# Patient Record
Sex: Female | Born: 1960 | Race: White | Hispanic: No | Marital: Married | Smoking: Former smoker
Health system: Southern US, Community
[De-identification: ages and names within clinical notes are randomized; demographics above are authoritative.]

## PROBLEM LIST (undated history)

## (undated) DIAGNOSIS — Z5189 Encounter for other specified aftercare: Secondary | ICD-10-CM

## (undated) DIAGNOSIS — G709 Myoneural disorder, unspecified: Secondary | ICD-10-CM

## (undated) DIAGNOSIS — N189 Chronic kidney disease, unspecified: Secondary | ICD-10-CM

## (undated) DIAGNOSIS — G629 Polyneuropathy, unspecified: Secondary | ICD-10-CM

## (undated) DIAGNOSIS — G8929 Other chronic pain: Secondary | ICD-10-CM

## (undated) DIAGNOSIS — M549 Dorsalgia, unspecified: Secondary | ICD-10-CM

## (undated) DIAGNOSIS — T7840XA Allergy, unspecified, initial encounter: Secondary | ICD-10-CM

## (undated) DIAGNOSIS — E785 Hyperlipidemia, unspecified: Secondary | ICD-10-CM

## (undated) DIAGNOSIS — N809 Endometriosis, unspecified: Secondary | ICD-10-CM

## (undated) DIAGNOSIS — I10 Essential (primary) hypertension: Secondary | ICD-10-CM

## (undated) HISTORY — DX: Myoneural disorder, unspecified: G70.9

## (undated) HISTORY — DX: Essential (primary) hypertension: I10

## (undated) HISTORY — DX: Chronic kidney disease, unspecified: N18.9

## (undated) HISTORY — DX: Allergy, unspecified, initial encounter: T78.40XA

## (undated) HISTORY — DX: Dorsalgia, unspecified: M54.9

## (undated) HISTORY — DX: Hyperlipidemia, unspecified: E78.5

## (undated) HISTORY — DX: Endometriosis, unspecified: N80.9

## (undated) HISTORY — DX: Encounter for other specified aftercare: Z51.89

## (undated) HISTORY — DX: Polyneuropathy, unspecified: G62.9

## (undated) HISTORY — DX: Other chronic pain: G89.29

---
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
## 1982-10-13 HISTORY — PX: SPINE SURGERY: SHX786

## 1992-10-13 HISTORY — PX: EYE SURGERY: SHX253

## 2006-10-13 HISTORY — PX: ABDOMINAL HYSTERECTOMY: SHX81

## 2007-10-14 HISTORY — PX: OOPHORECTOMY: SHX86

## 2008-10-13 DIAGNOSIS — G629 Polyneuropathy, unspecified: Secondary | ICD-10-CM

## 2008-10-13 HISTORY — DX: Polyneuropathy, unspecified: G62.9

## 2008-10-13 HISTORY — PX: KIDNEY STONE SURGERY: SHX686

## 2011-10-14 DIAGNOSIS — N809 Endometriosis, unspecified: Secondary | ICD-10-CM

## 2011-10-14 HISTORY — DX: Endometriosis, unspecified: N80.9

## 2017-11-16 ENCOUNTER — Ambulatory Visit: Payer: BC Managed Care – PPO | Admitting: Family Medicine

## 2017-11-16 ENCOUNTER — Encounter: Payer: Self-pay | Admitting: Family Medicine

## 2017-11-16 VITALS — BP 117/79 | HR 93 | Temp 97.8°F | Ht 66.0 in | Wt 219.0 lb

## 2017-11-16 DIAGNOSIS — I10 Essential (primary) hypertension: Secondary | ICD-10-CM

## 2017-11-16 DIAGNOSIS — Z780 Asymptomatic menopausal state: Secondary | ICD-10-CM | POA: Diagnosis not present

## 2017-11-16 DIAGNOSIS — R5383 Other fatigue: Secondary | ICD-10-CM | POA: Diagnosis not present

## 2017-11-16 DIAGNOSIS — Z1231 Encounter for screening mammogram for malignant neoplasm of breast: Secondary | ICD-10-CM | POA: Diagnosis not present

## 2017-11-16 DIAGNOSIS — N809 Endometriosis, unspecified: Secondary | ICD-10-CM | POA: Diagnosis not present

## 2017-11-16 DIAGNOSIS — Z1239 Encounter for other screening for malignant neoplasm of breast: Secondary | ICD-10-CM

## 2017-11-16 DIAGNOSIS — E782 Mixed hyperlipidemia: Secondary | ICD-10-CM

## 2017-11-16 DIAGNOSIS — E785 Hyperlipidemia, unspecified: Secondary | ICD-10-CM | POA: Insufficient documentation

## 2017-11-16 MED ORDER — "BD LUER-LOK SYRINGE 25G X 1"" 3 ML MISC": 1 refills | Status: AC

## 2017-11-16 MED ORDER — PRAVASTATIN SODIUM 40 MG PO TABS: 40.0000 mg | ORAL_TABLET | Freq: Every day | ORAL | 1 refills | Status: DC

## 2017-11-16 MED ORDER — DICYCLOMINE HCL 10 MG PO CAPS: 10.0000 mg | ORAL_CAPSULE | Freq: Three times a day (TID) | ORAL | 1 refills | Status: DC

## 2017-11-16 MED ORDER — GABAPENTIN 800 MG PO TABS: ORAL_TABLET | ORAL | 5 refills | Status: DC

## 2017-11-16 MED ORDER — BUTALBITAL-ACETAMINOPHEN 50-300 MG PO TABS: ORAL_TABLET | ORAL | 0 refills | Status: AC

## 2017-11-16 MED ORDER — LISINOPRIL 30 MG PO TABS: 30.0000 mg | ORAL_TABLET | Freq: Every day | ORAL | 1 refills | Status: DC

## 2017-11-16 MED ORDER — VERAPAMIL HCL 120 MG PO TABS: 120.0000 mg | ORAL_TABLET | Freq: Every day | ORAL | 1 refills | Status: DC
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
## 2017-11-17 LAB — CBC WITH DIFFERENTIAL/PLATELET
BASOS: 0 %
Basophils Absolute: 0 10*3/uL (ref 0.0–0.2)
EOS (ABSOLUTE): 0.4 10*3/uL (ref 0.0–0.4)
Eos: 4 %
HEMOGLOBIN: 13 g/dL (ref 11.1–15.9)
Hematocrit: 39.8 % (ref 34.0–46.6)
IMMATURE GRANS (ABS): 0 10*3/uL (ref 0.0–0.1)
Immature Granulocytes: 0 %
LYMPHS: 33 %
Lymphocytes Absolute: 3.4 10*3/uL — ABNORMAL HIGH (ref 0.7–3.1)
MCH: 30.9 pg (ref 26.6–33.0)
MCHC: 32.7 g/dL (ref 31.5–35.7)
MCV: 95 fL (ref 79–97)
Monocytes Absolute: 0.6 10*3/uL (ref 0.1–0.9)
Monocytes: 6 %
NEUTROS ABS: 5.8 10*3/uL (ref 1.4–7.0)
Neutrophils: 57 %
Platelets: 305 10*3/uL (ref 150–379)
RBC: 4.21 x10E6/uL (ref 3.77–5.28)
RDW: 13.4 % (ref 12.3–15.4)
WBC: 10.2 10*3/uL (ref 3.4–10.8)

## 2017-11-17 LAB — CMP14+EGFR
A/G RATIO: 1.8 (ref 1.2–2.2)
ALBUMIN: 4.5 g/dL (ref 3.5–5.5)
ALT: 21 IU/L (ref 0–32)
AST: 16 IU/L (ref 0–40)
Alkaline Phosphatase: 95 IU/L (ref 39–117)
BUN / CREAT RATIO: 13 (ref 9–23)
BUN: 12 mg/dL (ref 6–24)
Bilirubin Total: 0.3 mg/dL (ref 0.0–1.2)
CALCIUM: 9.2 mg/dL (ref 8.7–10.2)
CO2: 24 mmol/L (ref 20–29)
Chloride: 101 mmol/L (ref 96–106)
Creatinine, Ser: 0.96 mg/dL (ref 0.57–1.00)
GFR, EST AFRICAN AMERICAN: 78 mL/min/{1.73_m2} (ref >59)
GFR, EST NON AFRICAN AMERICAN: 67 mL/min/{1.73_m2} (ref >59)
GLOBULIN, TOTAL: 2.5 g/dL (ref 1.5–4.5)
Glucose: 111 mg/dL — ABNORMAL HIGH (ref 65–99)
POTASSIUM: 3.8 mmol/L (ref 3.5–5.2)
Sodium: 141 mmol/L (ref 134–144)
TOTAL PROTEIN: 7 g/dL (ref 6.0–8.5)

## 2017-11-17 LAB — TSH: TSH: 1.47 u[IU]/mL (ref 0.450–4.500)

## 2017-11-17 LAB — LIPID PANEL
CHOL/HDL RATIO: 2.9 ratio (ref 0.0–4.4)
Cholesterol, Total: 154 mg/dL (ref 100–199)
HDL: 53 mg/dL (ref >39)
LDL Calculated: 71 mg/dL (ref 0–99)
Triglycerides: 148 mg/dL (ref 0–149)
VLDL Cholesterol Cal: 30 mg/dL (ref 5–40)

## 2017-11-17 LAB — T4, FREE: Free T4: 1.37 ng/dL (ref 0.82–1.77)

## 2017-11-17 LAB — VITAMIN D 25 HYDROXY (VIT D DEFICIENCY, FRACTURES): VIT D 25 HYDROXY: 34.8 ng/mL (ref 30.0–100.0)

## 2017-11-19 ENCOUNTER — Telehealth: Payer: Self-pay | Admitting: Family Medicine

## 2017-12-07 ENCOUNTER — Telehealth: Payer: Self-pay | Admitting: Family Medicine

## 2017-12-21 ENCOUNTER — Ambulatory Visit: Payer: BC Managed Care – PPO | Admitting: Gynecology

## 2017-12-21 ENCOUNTER — Encounter: Payer: Self-pay | Admitting: Gynecology

## 2017-12-21 VITALS — BP 124/74 | Ht 65.0 in | Wt 218.0 lb

## 2017-12-21 DIAGNOSIS — Z124 Encounter for screening for malignant neoplasm of cervix: Secondary | ICD-10-CM | POA: Diagnosis not present

## 2017-12-21 DIAGNOSIS — R102 Pelvic and perineal pain: Secondary | ICD-10-CM

## 2017-12-21 DIAGNOSIS — N809 Endometriosis, unspecified: Secondary | ICD-10-CM

## 2017-12-21 NOTE — Progress Notes (Signed)
Bethany Ford 1960/12/21 [id number]        57 y.o.  G1P0010 new patient who presents with her husband complaining of pelvic pain.  She has a history of supracervical hysterectomy with RSO and then subsequently had LSO in 2008 and 2009 with reported endometriosis.  No use of hormones afterwards.  Was told that the endometriosis should resolve.  Has noted though she has had pain since to include daily sharp stabbing to aching pain.  Particularly noticeable when she voids or has bowel movements exacerbating the pain.  Also unable to have intercourse due to the pain.  No consistent diarrhea or constipation.  No nausea vomiting.  No fever or chills.  No UTI symptoms such as frequency dysuria urgency low back pain.  Patient also having significant hot flushes and sweats at that she has had since the hysterectomy.  We history of abnormal Pap smears as a teenager but none since.  Past medical history,surgical history, problem list, medications, allergies, family history and social history were all reviewed and documented as reviewed in the EPIC chart.  ROS:  Performed with pertinent positives and negatives included in the history, assessment and plan.   Additional significant findings : None   Exam: Kennon Portela assistant Vitals:   12/21/17 1400  BP: 124/74  Weight: 218 lb (98.9 kg)  Height: 5\' 5"  (1.651 m)   Body mass index is 36.28 kg/m.  General appearance:  Normal affect, orientation and appearance. Skin: Grossly normal HEENT: Without gross lesions.  No cervical or supraclavicular adenopathy. Thyroid normal.  Lungs:  Clear without wheezing, rales or rhonchi Cardiac: RR, without RMG Abdominal:  Soft, nontender, without masses, guarding, rebound, organomegaly or hernia Breasts:  Examined lying and sitting without masses, retractions, discharge or axillary adenopathy. Pelvic:  Ext, BUS, Vagina: Grossly normal on exam.  Patient having extreme pain with speculum exam  Cervix: Apparent at the  top of the vagina.  Pap smear done.  Exam somewhat limited by patient's pain.  Uterus: Not palpated  Adnexa: Without masses or tenderness    Anus and perineum: Normal   Rectovaginal: Normal sphincter tone without palpated masses or tenderness.    Assessment/Plan:  57 y.o. G1P0010 .   1. Pelvic pain.  History of endometriosis with reported supracervical hysterectomy and RSO and subsequent separate LSO.  No hormone use afterwards.  Also having hot flashes and sweats.  Recent TSH CBC comprehensive metabolic panel and lipid profile were normal.  We reviewed the possibilities to include active endometriosis although unlikely after BSO.  Possible adhesions leading to her pain with due to endometriosis in the past.  Primary bladder such as interstitial cystitis noting she does have bladder pain particularly when her bladder is full when she voids although not having frequency dysuria urgency or other type symptoms.  GI etiology noting again with bowel movements she has exacerbation of her pain.  Her last colonoscopy was 10 years ago but says she could never go undergo another one because of the bowel prep leading to diarrhea which causes intense pain.  We discussed that there really is a blurred between the various organ systems as far as her pain is concerned with some bladder symptoms, some GI symptoms and a history of endometriosis and possible adhesions.  We discussed possible treatment options to include possible vaginal estrogen given the intense discomfort when I try to do her pelvic exam with the speculum although with digital exam it was not as bad.  The issues of vaginal  estrogen and absorption with systemic effects and the possible also reviewed.  Systemic HRT discussed to also treat her hot flashes and sweats but she is not interested at this point of considering HRT.  At this point I would like to see her operative reports from both her hysterectomy and her subsequent LSO.  Discussed possible CT scan  or other imaging to look at her pelvis to make sure there is nothing more significant as a cause for her pain.  [facility].  Possible urology workup for interstitial cystitis and GI evaluation also discussed.  Will further discuss after reviewing her operative reports.  We will check baseline urine analysis today and FSH just to verify she is menopausal. 2. Colonoscopy 2008.  Need to consider now discussed although she feels that she could not tolerate the prep.  She will further discuss colon screening with her primary physician. 3. Mammography 2013.  Reminded patient she is overdue and she agrees to schedule.  Breast exam normal today. 4. Pap smear 2008.  Reported abnormal Pap smears as a teenager but normal since.  Pap smear done today as she still has her cervix as it appears on exam. 5. Health maintenance.  No routine lab work done as she recently had this done.  Patient will follow up with her operative reports and for her lab work and then will go from there.  Greater than 50% of my time was spent in direct face to face counseling and coordination of care with the patient.    Dara Lordsimothy P Via Rosado MD, 2:28 PM 12/21/2017

## 2017-12-21 NOTE — Addendum Note (Signed)
Addended by: Dayna BarkerGARDNER, Drue Camera K on: 12/21/2017 03:38 PM   Modules accepted: Orders

## 2017-12-21 NOTE — Patient Instructions (Signed)
Office will call with lab test results and after reviewing the operative reports from your prior surgery.

## 2017-12-22 LAB — FOLLICLE STIMULATING HORMONE: FSH: 46.7 m[IU]/mL

## 2017-12-24 LAB — PAP IG W/ RFLX HPV ASCU

## 2017-12-30 ENCOUNTER — Encounter: Payer: Self-pay | Admitting: Gynecology

## 2018-01-15 ENCOUNTER — Ambulatory Visit: Payer: BC Managed Care – PPO | Admitting: Family Medicine

## 2018-01-15 ENCOUNTER — Encounter: Payer: Self-pay | Admitting: Family Medicine

## 2018-01-15 VITALS — BP 123/81 | HR 86 | Temp 98.1°F | Ht 65.0 in | Wt 221.0 lb

## 2018-01-15 DIAGNOSIS — I10 Essential (primary) hypertension: Secondary | ICD-10-CM | POA: Diagnosis not present

## 2018-01-15 DIAGNOSIS — N921 Excessive and frequent menstruation with irregular cycle: Secondary | ICD-10-CM

## 2018-01-15 DIAGNOSIS — R0683 Snoring: Secondary | ICD-10-CM | POA: Diagnosis not present

## 2018-01-15 DIAGNOSIS — E782 Mixed hyperlipidemia: Secondary | ICD-10-CM

## 2018-01-15 NOTE — Progress Notes (Signed)
Subjective:  Patient ID: Bethany Ford, female    DOB: 1961/03/03  Age: 57 y.o. MRN: [id number]  CC: Follow-up (pt here today for follow up and wants to discuss her medical history)   HPI Bethany Ford presents for  Follow-up of hypertension. Patient has no history of headache chest pain or shortness of breath or recent cough. Patient also denies symptoms of TIA such as numbness weakness lateralizing. Patient checks  blood pressure at home and has not had any elevated readings recently. Patient denies side effects from his medication. States taking it regularly. Patient in for follow-up of elevated cholesterol. Doing well without complaints on current medication. Denies side effects of statin including myalgia and arthralgia and nausea. Also in today for liver function testing. Currently no chest pain, shortness of breath or other cardiovascular related symptoms noted.  Patient is also having intermenstrual bleeding.  She would like to have a referral to a gynecologist.  She also has chronic back pain for which she takes oxycodone and gabapentin.  Depression screen PHQ 2/9 01/15/2018  Decreased Interest 0  Down, Depressed, Hopeless 0  PHQ - 2 Score 0    History Bethany Ford has a past medical history of Allergy, Blood transfusion without reported diagnosis (1982), Chronic back pain, Chronic kidney disease, Endometriosis (2013), Hyperlipidemia, Hypertension, Neuromuscular disorder (HCC), and Neuropathy (2010).   She has a past surgical history that includes Eye surgery (Left, 1994); Spine surgery (1982); Spine surgery (1984); Kidney stone surgery (2010); Oophorectomy (2009); and Abdominal hysterectomy (2008).   Her family history includes Cancer in her brother and father; Diabetes in her father; Heart disease in her maternal grandfather and mother; Hyperlipidemia in her mother and sister; Hypertension in her mother; Learning disabilities in her brother, brother, and brother; Multiple sclerosis in her  brother and sister.She reports that she quit smoking about 20 years ago. Her smoking use included cigarettes. She has a 20.00 pack-year smoking history. She has never used smokeless tobacco. She reports that she does not drink alcohol or use drugs.    ROS Review of Systems  Constitutional: Negative.   HENT: Negative for congestion.   Eyes: Negative for visual disturbance.  Respiratory: Negative for shortness of breath.   Cardiovascular: Negative for chest pain.  Gastrointestinal: Negative for abdominal pain, constipation, diarrhea, nausea and vomiting.  Genitourinary: Negative for difficulty urinating.  Musculoskeletal: Negative for arthralgias and myalgias.  Neurological: Negative for headaches.  Psychiatric/Behavioral: Negative for sleep disturbance.    Objective:  BP 123/81   Pulse 86   Temp 98.1 F (36.7 C) (Oral)   Ht 5\' 5"  (1.651 m)   Wt 221 lb (100.2 kg)   BMI 36.78 kg/m    BP Readings from Last 3 Encounters:  01/15/18 123/81  12/21/17 124/74  11/16/17 117/79    Wt Readings from Last 3 Encounters:  01/15/18 221 lb (100.2 kg)  12/21/17 218 lb (98.9 kg)  11/16/17 219 lb (99.3 kg)     Physical Exam  Constitutional: She is oriented to person, place, and time. She appears well-developed and well-nourished. No distress.  HENT:  Head: Normocephalic and atraumatic.  Right Ear: External ear normal.  Left Ear: External ear normal.  Neck: Normal range of motion. Neck supple.  Cardiovascular: Normal rate, regular rhythm and normal heart sounds.  No murmur heard. Pulmonary/Chest: Effort normal and breath sounds normal. No respiratory distress. She has no wheezes. She has no rales.  Abdominal: Soft. There is no tenderness.  Musculoskeletal: Normal range of motion. She exhibits  no edema or tenderness.  Neurological: She is alert and oriented to person, place, and time. She exhibits normal muscle tone. Coordination normal.  Skin: Skin is warm and dry.  Psychiatric: She  has a normal mood and affect. Her behavior is normal.      Assessment & Plan:   [name] was seen today for follow-up.  Diagnoses and all orders for this visit:  Essential hypertension  Mixed hyperlipidemia  Snoring -     Ambulatory referral to Sleep Studies  Metrorrhagia -     Ambulatory referral to Gynecology       I am having Bethany Doffing maintain her cyanocobalamin, fluticasone, oxycodone, B-D 3CC LUER-LOK SYR 25GX1", dicyclomine, gabapentin, lisinopril, pravastatin, verapamil, Butalbital-Acetaminophen, and phenytoin.  Allergies as of 01/15/2018      Reactions   Latex Hives   severe   Vitamin E Other (See Comments)   Severe neuropathy   Codeine Nausea And Vomiting   moderate      Medication List        Accurate as of 01/15/18 11:59 PM. Always use your most recent med list.          B-D 3CC LUER-LOK SYR 25GX1" 25G X 1" 3 ML Misc Generic drug:  SYRINGE-NEEDLE (DISP) 3 ML 12use as directed every week   Butalbital-Acetaminophen 50-300 MG Tabs 1 BID prn HA   cyanocobalamin 1000 MCG/ML injection Commonly known as:  (VITAMIN B-12) inject 1 cc intramuscularly weekly for 30 DAYS   dicyclomine 10 MG capsule Commonly known as:  BENTYL Take 1 capsule (10 mg total) by mouth 3 (three) times daily before meals.   fluticasone 50 MCG/ACT nasal spray Commonly known as:  FLONASE   gabapentin 800 MG tablet Commonly known as:  NEURONTIN take 1 and 1/2 tablet by mouth three times a day   lisinopril 30 MG tablet Commonly known as:  PRINIVIL,ZESTRIL Take 1 tablet (30 mg total) by mouth daily.   oxycodone 30 MG immediate release tablet Commonly known as:  ROXICODONE take 1 tablet by mouth every 6 hours if needed for 30 DAYS   phenytoin 100 MG ER capsule Commonly known as:  DILANTIN TK 1 C PO TID   pravastatin 40 MG tablet Commonly known as:  PRAVACHOL Take 1 tablet (40 mg total) by mouth daily.   verapamil 120 MG tablet Commonly known as:  CALAN Take 1  tablet (120 mg total) by mouth daily.        Follow-up: Return in about 6 months (around 07/17/2018).  Mechele Claude, M.D.

## 2018-01-17 ENCOUNTER — Encounter: Payer: Self-pay | Admitting: Family Medicine

## 2018-01-19 ENCOUNTER — Telehealth: Payer: Self-pay | Admitting: *Deleted

## 2018-01-19 NOTE — Telephone Encounter (Signed)
We received referral [facility] with diagnosis metrorrhagia. Per Dr.Fontaine note on 12/21/17 patient has had hysterectomy. I called patient to discuss and patient said she is not having any vaginal bleeding. I told her the PCP had sent a referral to this office about vaginal bleeding, once again pt declined stating "I have had a hysterectomy and no bleeding has occurred". I confirmed her date of birth and the PCP office and it all matched. So not referral will be made to this office regarding this, as apparently this was sent in error.

## 2018-03-09 LAB — HM MAMMOGRAPHY

## 2018-03-10 ENCOUNTER — Ambulatory Visit: Payer: BC Managed Care – PPO | Admitting: Family Medicine

## 2018-03-10 ENCOUNTER — Encounter: Payer: Self-pay | Admitting: Family Medicine

## 2018-03-10 VITALS — BP 117/81 | HR 92 | Temp 97.8°F | Ht 65.0 in | Wt 221.0 lb

## 2018-03-10 DIAGNOSIS — N3000 Acute cystitis without hematuria: Secondary | ICD-10-CM | POA: Diagnosis not present

## 2018-03-10 DIAGNOSIS — I10 Essential (primary) hypertension: Secondary | ICD-10-CM | POA: Diagnosis not present

## 2018-03-10 LAB — URINALYSIS, COMPLETE
Bilirubin, UA: NEGATIVE
Glucose, UA: NEGATIVE
Ketones, UA: NEGATIVE
Nitrite, UA: POSITIVE — AB
PH UA: 6 (ref 5.0–7.5)
PROTEIN UA: NEGATIVE
Specific Gravity, UA: 1.015 (ref 1.005–1.030)
UUROB: 0.2 mg/dL (ref 0.2–1.0)

## 2018-03-10 LAB — MICROSCOPIC EXAMINATION
RBC, UA: >30 /hpf — AB (ref 0–2)
RENAL EPITHEL UA: NONE SEEN /HPF

## 2018-03-10 MED ORDER — SULFAMETHOXAZOLE-TRIMETHOPRIM 800-160 MG PO TABS: 1.0000 | ORAL_TABLET | Freq: Two times a day (BID) | ORAL | 0 refills | Status: DC

## 2018-03-10 NOTE — Progress Notes (Signed)
BP 117/81   Pulse 92   Temp 97.8 F (36.6 C) (Oral)   Ht  (1.651 m)   Wt 221 lb (100.2 kg)   BMI 36.78 kg/m    Subjective:    Patient ID: Bethany Ford, female    DOB: [birth year], 57 y.o.   MRN: [id number]  HPI: Bethany Ford is a 57 y.o. female presenting on 03/10/2018 for Discuss BP (Feels dizzy and drunk at times) and Dysuria   HPI Lightheaded and dizzy and hypotensive episodes Patient has been having lightheaded and dizzy and hypotensive episodes that have been occurring over the past couple weeks.  She does admit that she started using her Flomax on top of her normal blood pressure medication and has been taking her blood pressure at home and it has been on the lower end.  Urinary frequency and burning and pain Patient has dysuria and burning and lower abdominal pain that has been going on for the past 3 days.  She denies any hematuria or fevers or chills or flank pain.  She has been trying to increase her fluid intake to help flush out which has been helping some.  Relevant past medical, surgical, family and social history reviewed and updated as indicated. Interim medical history since our last visit reviewed. Allergies and medications reviewed and updated.  Review of Systems  Constitutional: Negative for chills and fever.  Eyes: Negative for visual disturbance.  Respiratory: Negative for chest tightness and shortness of breath.   Cardiovascular: Negative for chest pain and leg swelling.  Genitourinary: Positive for difficulty urinating, dysuria, frequency and urgency.  Musculoskeletal: Negative for back pain and gait problem.  Skin: Negative for rash.  Neurological: Positive for dizziness and light-headedness. Negative for weakness, numbness and headaches.  Psychiatric/Behavioral: Negative for agitation and behavioral problems.  All other systems reviewed and are negative.   Per HPI unless specifically indicated above   Allergies as of 03/10/2018      Reactions    Latex Hives   severe   Vitamin E Other (See Comments)   Severe neuropathy   Codeine Nausea And Vomiting   moderate      Medication List        Accurate as of 03/10/18  8:54 AM. Always use your most recent med list.          B-D 3CC LUER-LOK SYR 25GX1" 25G X 1" 3 ML Misc Generic drug:  SYRINGE-NEEDLE (DISP) 3 ML 12use as directed every week   Butalbital-Acetaminophen 50-300 MG Tabs 1 BID prn HA   cyanocobalamin 1000 MCG/ML injection Commonly known as:  (VITAMIN B-12) inject 1 cc intramuscularly weekly for 30 DAYS   dicyclomine 10 MG capsule Commonly known as:  BENTYL Take 1 capsule (10 mg total) by mouth 3 (three) times daily before meals.   fluticasone 50 MCG/ACT nasal spray Commonly known as:  FLONASE   gabapentin 300 MG capsule Commonly known as:  NEURONTIN Take 300 mg by mouth 4 (four) times daily.   lisinopril 30 MG tablet Commonly known as:  PRINIVIL,ZESTRIL Take 1 tablet (30 mg total) by mouth daily.   oxycodone 30 MG immediate release tablet Commonly known as:  ROXICODONE take 1 tablet by mouth every 6 hours if needed for 30 DAYS   pravastatin 40 MG tablet Commonly known as:  PRAVACHOL Take 1 tablet (40 mg total) by mouth daily.   tamsulosin 0.4 MG Caps capsule Commonly known as:  FLOMAX Take 0.4 mg by mouth.   URIBEL PO  Take by mouth.          Objective:    BP 117/81   Pulse 92   Temp 97.8 F (36.6 C) (Oral)   Ht  (1.651 m)   Wt 221 lb (100.2 kg)   BMI 36.78 kg/m   Wt Readings from Last 3 Encounters:  03/10/18 221 lb (100.2 kg)  01/15/18 221 lb (100.2 kg)  12/21/17 218 lb (98.9 kg)    Physical Exam  Constitutional: She is oriented to person, place, and time. She appears well-developed and well-nourished. No distress.  Eyes: Pupils are equal, round, and reactive to light. Conjunctivae and EOM are normal.  Cardiovascular: Normal rate, regular rhythm, normal heart sounds and intact distal pulses.  No murmur  heard. Pulmonary/Chest: Effort normal and breath sounds normal. No respiratory distress. She has no wheezes.  Abdominal: Soft. Bowel sounds are normal. She exhibits no distension. There is no tenderness. There is no rigidity, no guarding and no CVA tenderness.  Musculoskeletal: Normal range of motion. She exhibits no edema.  Neurological: She is alert and oriented to person, place, and time. Coordination normal.  Skin: Skin is warm and dry. No rash noted. She is not diaphoretic.  Psychiatric: She has a normal mood and affect. Her behavior is normal.  Nursing note and vitals reviewed.   Urinalysis WBC greater than 30 RBC greater than 30, many bacteria, nitrite positive, 3+ blood, 2+ leukocytes, 1+ bilirubin    Assessment & Plan:   Problem List Items Addressed This Visit      Cardiovascular and Mediastinum   Hypertension    Other Visit Diagnoses    Acute cystitis without hematuria    -  Primary   Relevant Medications   sulfamethoxazole-trimethoprim (BACTRIM DS,SEPTRA DS) 800-160 MG tablet   Other Relevant Orders   Urinalysis, Complete (Completed)    Patient is having hypotensive episodes in the mornings, it looks like it is a combination between when she starts using her Flomax blood pressure kicks down and the verapamil.  We will discontinue the verapamil and see how her blood pressures do when she is on and off the Flomax but may need to consider in the future a blood pressure medication that she uses only when she is not using the Flomax.  Follow up plan: Return if symptoms worsen or fail to improve, for Patient already has follow-up appointment in August.  Counseling provided for all of the vaccine components Orders Placed This Encounter  Procedures  . Urinalysis, Complete    Arville Care, MD [facility] 03/10/2018, 8:54 AM

## 2018-04-12 ENCOUNTER — Encounter: Payer: Self-pay | Admitting: Family Medicine

## 2018-04-12 ENCOUNTER — Ambulatory Visit: Payer: BC Managed Care – PPO | Admitting: Family Medicine

## 2018-04-12 VITALS — BP 131/89 | HR 88 | Temp 97.9°F | Ht 65.0 in | Wt 221.0 lb

## 2018-04-12 DIAGNOSIS — N3000 Acute cystitis without hematuria: Secondary | ICD-10-CM | POA: Diagnosis not present

## 2018-04-12 LAB — URINALYSIS, COMPLETE
Bilirubin, UA: NEGATIVE
GLUCOSE, UA: NEGATIVE
KETONES UA: NEGATIVE
NITRITE UA: NEGATIVE
Specific Gravity, UA: 1.015 (ref 1.005–1.030)
UUROB: 0.2 mg/dL (ref 0.2–1.0)
pH, UA: 6.5 (ref 5.0–7.5)

## 2018-04-12 LAB — MICROSCOPIC EXAMINATION: Renal Epithel, UA: NONE SEEN /hpf

## 2018-04-12 MED ORDER — URIBEL 118 MG PO CAPS
1.0000 | ORAL_CAPSULE | Freq: Four times a day (QID) | ORAL | 3 refills | Status: DC | PRN
Start: 2018-04-12 — End: 2018-12-01

## 2018-04-12 MED ORDER — CIPROFLOXACIN HCL 500 MG PO TABS: 500.0000 mg | ORAL_TABLET | Freq: Two times a day (BID) | ORAL | 0 refills | Status: DC

## 2018-04-12 MED ORDER — AMLODIPINE BESYLATE 5 MG PO TABS: 5.0000 mg | ORAL_TABLET | Freq: Every day | ORAL | 3 refills | Status: DC

## 2018-04-12 NOTE — Progress Notes (Signed)
BP 131/89   Pulse 88   Temp 97.9 F (36.6 C) (Oral)   Ht 5\' 5"  (1.651 m)   Wt 221 lb (100.2 kg)   BMI 36.78 kg/m    Subjective:    Patient ID: Bethany Ford, female    DOB: 29-Oct-1960, 57 y.o.   MRN: [id number]  HPI: Bethany Ford is a 57 y.o. female presenting on 04/12/2018 for Urinary Tract Infection (bladder pain, urinary pain) and Refill Uribel   HPI Dysuria and frequency and bladder pain Patient is coming in with complaints of dysuria and frequency and bladder pain that has been going on for the past 1 day.  She says it mostly started overnight and this morning.  She started using the Uribel which she has help with the bladder pain and it has been helping some.  She denies any fevers or chills or lower abdominal pain or flank pain.  Relevant past medical, surgical, family and social history reviewed and updated as indicated. Interim medical history since our last visit reviewed. Allergies and medications reviewed and updated.  Review of Systems  Constitutional: Negative for chills and fever.  HENT: Negative for congestion, ear discharge and ear pain.   Eyes: Negative for redness and visual disturbance.  Respiratory: Negative for chest tightness and shortness of breath.   Cardiovascular: Negative for chest pain and leg swelling.  Gastrointestinal: Negative for abdominal pain, diarrhea and nausea.  Genitourinary: Positive for dysuria and frequency. Negative for decreased urine volume, difficulty urinating, flank pain, pelvic pain, urgency, vaginal bleeding, vaginal discharge and vaginal pain.  Musculoskeletal: Negative for back pain and gait problem.  Skin: Negative for rash.  Neurological: Negative for light-headedness and headaches.  Psychiatric/Behavioral: Negative for agitation and behavioral problems.  All other systems reviewed and are negative.   Per HPI unless specifically indicated above   Allergies as of 04/12/2018      Reactions   Latex Hives   severe   Vitamin E Other (See Comments)   Severe neuropathy   Codeine Nausea And Vomiting   moderate      Medication List        Accurate as of 04/12/18  2:59 PM. Always use your most recent med list.          amLODipine 5 MG tablet Commonly known as:  NORVASC Take 1 tablet (5 mg total) by mouth daily.   B-D 3CC LUER-LOK SYR 25GX1" 25G X 1" 3 ML Misc Generic drug:  SYRINGE-NEEDLE (DISP) 3 ML 12use as directed every week   Butalbital-Acetaminophen 50-300 MG Tabs 1 BID prn HA   ciprofloxacin 500 MG tablet Commonly known as:  CIPRO Take 1 tablet (500 mg total) by mouth 2 (two) times daily.   cyanocobalamin 1000 MCG/ML injection Commonly known as:  (VITAMIN B-12) inject 1 cc intramuscularly weekly for 30 DAYS   dicyclomine 10 MG capsule Commonly known as:  BENTYL Take 1 capsule (10 mg total) by mouth 3 (three) times daily before meals.   fluticasone 50 MCG/ACT nasal spray Commonly known as:  FLONASE   gabapentin 300 MG capsule Commonly known as:  NEURONTIN Take 300 mg by mouth 4 (four) times daily.   lisinopril 30 MG tablet Commonly known as:  PRINIVIL,ZESTRIL Take 1 tablet (30 mg total) by mouth daily.   oxycodone 30 MG immediate release tablet Commonly known as:  ROXICODONE take 1 tablet by mouth every 6 hours if needed for 30 DAYS   pravastatin 40 MG tablet Commonly known as:  PRAVACHOL  Take 1 tablet (40 mg total) by mouth daily.   tamsulosin 0.4 MG Caps capsule Commonly known as:  FLOMAX Take 0.4 mg by mouth daily as needed.   URIBEL 118 MG Caps Take 1 capsule (118 mg total) by mouth 4 (four) times daily as needed.          Objective:    BP 131/89   Pulse 88   Temp 97.9 F (36.6 C) (Oral)   Ht 5\' 5"  (1.651 m)   Wt 221 lb (100.2 kg)   BMI 36.78 kg/m   Wt Readings from Last 3 Encounters:  04/12/18 221 lb (100.2 kg)  03/10/18 221 lb (100.2 kg)  01/15/18 221 lb (100.2 kg)    Physical Exam  Constitutional: She is oriented to person, place, and time.  She appears well-developed and well-nourished. No distress.  Eyes: Conjunctivae are normal.  Neck: Neck supple. No thyromegaly present.  Cardiovascular: Normal rate, regular rhythm, normal heart sounds and intact distal pulses.  No murmur heard. Pulmonary/Chest: Effort normal and breath sounds normal. No respiratory distress. She has no wheezes.  Abdominal: Soft. Bowel sounds are normal. She exhibits no distension and no mass. There is no tenderness. There is no guarding.  Musculoskeletal: Normal range of motion. She exhibits no edema or tenderness.  Lymphadenopathy:    She has no cervical adenopathy.  Neurological: She is alert and oriented to person, place, and time. Coordination normal.  Skin: Skin is warm and dry. No rash noted. She is not diaphoretic.  Psychiatric: She has a normal mood and affect. Her behavior is normal.  Nursing note and vitals reviewed.   Urinalysis: Greater than 30 WBCs, greater than 30 RBCs, 0-10 epithelial cells, trace ketones.    Assessment & Plan:   Problem List Items Addressed This Visit    None    Visit Diagnoses    Acute cystitis without hematuria    -  Primary   Relevant Medications   Meth-Hyo-M Bl-Na Phos-Ph Sal (URIBEL) 118 MG CAPS   ciprofloxacin (CIPRO) 500 MG tablet   Other Relevant Orders   Urinalysis, Complete (Completed)   Urine Culture       Follow up plan: Return if symptoms worsen or fail to improve.  Counseling provided for all of the vaccine components Orders Placed This Encounter  Procedures  . Urine Culture  . Urinalysis, Complete    Arville CareJoshua Dettinger, MD [facility] 04/12/2018, 2:59 PM

## 2018-04-14 LAB — URINE CULTURE

## 2018-04-18 ENCOUNTER — Other Ambulatory Visit: Payer: Self-pay | Admitting: Family Medicine

## 2018-05-17 ENCOUNTER — Ambulatory Visit: Payer: BC Managed Care – PPO | Admitting: Family Medicine

## 2018-05-17 ENCOUNTER — Encounter: Payer: Self-pay | Admitting: Family Medicine

## 2018-05-17 VITALS — BP 138/93 | HR 74 | Temp 97.0°F | Ht 65.0 in | Wt 223.2 lb

## 2018-05-17 DIAGNOSIS — E782 Mixed hyperlipidemia: Secondary | ICD-10-CM

## 2018-05-17 DIAGNOSIS — I10 Essential (primary) hypertension: Secondary | ICD-10-CM | POA: Diagnosis not present

## 2018-05-17 DIAGNOSIS — M5136 Other intervertebral disc degeneration, lumbar region: Secondary | ICD-10-CM

## 2018-05-17 LAB — CMP14+EGFR
ALK PHOS: 98 IU/L (ref 39–117)
ALT: 17 IU/L (ref 0–32)
AST: 19 IU/L (ref 0–40)
Albumin/Globulin Ratio: 1.6 (ref 1.2–2.2)
Albumin: 4.5 g/dL (ref 3.5–5.5)
BILIRUBIN TOTAL: 0.3 mg/dL (ref 0.0–1.2)
BUN/Creatinine Ratio: 19 (ref 9–23)
BUN: 17 mg/dL (ref 6–24)
CHLORIDE: 101 mmol/L (ref 96–106)
CO2: 24 mmol/L (ref 20–29)
Calcium: 9.7 mg/dL (ref 8.7–10.2)
Creatinine, Ser: 0.9 mg/dL (ref 0.57–1.00)
GFR calc Af Amer: 84 mL/min/{1.73_m2} (ref >59)
GFR calc non Af Amer: 73 mL/min/{1.73_m2} (ref >59)
GLOBULIN, TOTAL: 2.9 g/dL (ref 1.5–4.5)
Glucose: 89 mg/dL (ref 65–99)
Potassium: 4 mmol/L (ref 3.5–5.2)
SODIUM: 139 mmol/L (ref 134–144)
Total Protein: 7.4 g/dL (ref 6.0–8.5)

## 2018-05-17 LAB — CBC WITH DIFFERENTIAL/PLATELET
Basophils Absolute: 0 10*3/uL (ref 0.0–0.2)
Basos: 0 %
EOS (ABSOLUTE): 0.4 10*3/uL (ref 0.0–0.4)
EOS: 6 %
HEMATOCRIT: 38.9 % (ref 34.0–46.6)
Hemoglobin: 13 g/dL (ref 11.1–15.9)
Immature Grans (Abs): 0 10*3/uL (ref 0.0–0.1)
Immature Granulocytes: 0 %
LYMPHS ABS: 3 10*3/uL (ref 0.7–3.1)
Lymphs: 42 %
MCH: 30.6 pg (ref 26.6–33.0)
MCHC: 33.4 g/dL (ref 31.5–35.7)
MCV: 92 fL (ref 79–97)
Monocytes Absolute: 0.6 10*3/uL (ref 0.1–0.9)
Monocytes: 8 %
Neutrophils Absolute: 3.1 10*3/uL (ref 1.4–7.0)
Neutrophils: 44 %
Platelets: 293 10*3/uL (ref 150–450)
RBC: 4.25 x10E6/uL (ref 3.77–5.28)
RDW: 13.9 % (ref 12.3–15.4)
WBC: 7.2 10*3/uL (ref 3.4–10.8)

## 2018-05-17 LAB — LIPID PANEL
CHOLESTEROL TOTAL: 171 mg/dL (ref 100–199)
Chol/HDL Ratio: 2.9 ratio (ref 0.0–4.4)
HDL: 59 mg/dL (ref >39)
LDL Calculated: 92 mg/dL (ref 0–99)
Triglycerides: 98 mg/dL (ref 0–149)
VLDL Cholesterol Cal: 20 mg/dL (ref 5–40)

## 2018-05-17 MED ORDER — FLUTICASONE PROPIONATE 50 MCG/ACT NA SUSP: 2.0000 | Freq: Every day | NASAL | 5 refills | Status: DC

## 2018-05-17 MED ORDER — NYSTATIN 100000 UNIT/GM EX POWD: Freq: Four times a day (QID) | CUTANEOUS | 5 refills | Status: DC

## 2018-05-17 MED ORDER — TAMSULOSIN HCL 0.4 MG PO CAPS: 0.4000 mg | ORAL_CAPSULE | Freq: Every day | ORAL | 2 refills | Status: DC | PRN

## 2018-05-17 NOTE — Progress Notes (Signed)
BP (!) 138/93   Pulse 74   Temp (!) 97 F (36.1 C) (Oral)   Ht _0  (1.651 m)   Wt 223 lb 3.2 oz (101.2 kg)   BMI 37.14 kg/m    Subjective:    Patient ID: Bethany Ford, female    DOB: [birth year], 57 y.o.   MRN: [id number]  HPI: Bethany Ford is a 57 y.o. female presenting on 05/17/2018 for Hyperlipidemia (6 month follow up) and Hypertension   HPI Hypertension Patient is currently on lisinopril and amlodipine, and their blood pressure today is 138/93 is. Patient denies any lightheadedness or dizziness. Patient denies headaches, blurred vision, chest pains, shortness of breath, or weakness. Denies any side effects from medication and is content with current medication.   Hyperlipidemia Patient is coming in for recheck of his hyperlipidemia. The patient is currently taking pravastatin. They deny any issues with myalgias or history of liver damage from it. They deny any focal numbness or weakness or chest pain.   Chronic low back pain and degenerative disc disease Patient has chronic low back pain and sees a pain management doctor for this.  She is currently on oxycodone and gabapentin which she gets from them.  She says this is controlling it for the most part.  Relevant past medical, surgical, family and social history reviewed and updated as indicated. Interim medical history since our last visit reviewed. Allergies and medications reviewed and updated.  Review of Systems  Constitutional: Negative for chills and fever.  HENT: Negative for congestion, ear discharge and ear pain.   Eyes: Negative for redness and visual disturbance.  Respiratory: Negative for chest tightness and shortness of breath.   Cardiovascular: Negative for chest pain and leg swelling.  Musculoskeletal: Positive for back pain. Negative for gait problem.  Skin: Negative for rash.  Neurological: Negative for light-headedness and headaches.  Psychiatric/Behavioral: Negative for agitation and behavioral  problems.  All other systems reviewed and are negative.   Per HPI unless specifically indicated above   Allergies as of 05/17/2018      Reactions   Latex Hives   severe   Vitamin E Other (See Comments)   Severe neuropathy   Codeine Nausea And Vomiting   moderate      Medication List        Accurate as of 05/17/18 11:03 AM. Always use your most recent med list.          amLODipine 5 MG tablet Commonly known as:  NORVASC Take 1 tablet (5 mg total) by mouth daily.   B-D 3CC LUER-LOK SYR 25GX1" 25G X 1" 3 ML Misc Generic drug:  SYRINGE-NEEDLE (DISP) 3 ML 12use as directed every week   Butalbital-Acetaminophen 50-300 MG Tabs 1 BID prn HA   cyanocobalamin 1000 MCG/ML injection Commonly known as:  (VITAMIN B-12) inject 1 cc intramuscularly weekly for 30 DAYS   dicyclomine 10 MG capsule Commonly known as:  BENTYL TAKE ONE CAPSULE BY MOUTH THREE TIMES DAILY BEFORE MEALS   fluticasone 50 MCG/ACT nasal spray Commonly known as:  FLONASE   gabapentin 300 MG capsule Commonly known as:  NEURONTIN Take 300 mg by mouth 4 (four) times daily.   lisinopril 30 MG tablet Commonly known as:  PRINIVIL,ZESTRIL Take 1 tablet (30 mg total) by mouth daily.   nystatin powder Commonly known as:  MYCOSTATIN/NYSTOP Apply topically 4 (four) times daily.   oxycodone 30 MG immediate release tablet Commonly known as:  ROXICODONE take 1 tablet by mouth every 6  hours if needed for 30 DAYS   pravastatin 40 MG tablet Commonly known as:  PRAVACHOL Take 1 tablet (40 mg total) by mouth daily.   tamsulosin 0.4 MG Caps capsule Commonly known as:  FLOMAX Take 0.4 mg by mouth daily as needed.   URIBEL 118 MG Caps Take 1 capsule (118 mg total) by mouth 4 (four) times daily as needed.          Objective:    BP (!) 138/93   Pulse 74   Temp (!) 97 F (36.1 C) (Oral)   Ht _0  (1.651 m)   Wt 223 lb 3.2 oz (101.2 kg)   BMI 37.14 kg/m   Wt Readings from Last 3 Encounters:  05/17/18  223 lb 3.2 oz (101.2 kg)  04/12/18 221 lb (100.2 kg)  03/10/18 221 lb (100.2 kg)    Physical Exam  Constitutional: She is oriented to person, place, and time. She appears well-developed and well-nourished. No distress.  Eyes: Conjunctivae are normal.  Neck: Neck supple. No thyromegaly present.  Cardiovascular: Normal rate, regular rhythm, normal heart sounds and intact distal pulses.  No murmur heard. Pulmonary/Chest: Effort normal and breath sounds normal. No respiratory distress. She has no wheezes.  Musculoskeletal: Normal range of motion. She exhibits tenderness (Bilateral low back pain in a bandlike area, negative straight leg raise bilateral). She exhibits no edema.  Neurological: She is alert and oriented to person, place, and time. Coordination normal.  Skin: Skin is warm and dry. No rash noted. She is not diaphoretic.  Psychiatric: She has a normal mood and affect. Her behavior is normal.  Nursing note and vitals reviewed.       Assessment & Plan:   Problem List Items Addressed This Visit      Cardiovascular and Mediastinum   Hypertension - Primary   Relevant Orders   CMP14+EGFR (Completed)     Musculoskeletal and Integument   Degenerative disc disease, lumbar   Relevant Orders   CBC with Differential/Platelet (Completed)     Other   Hyperlipidemia   Relevant Orders   Lipid panel (Completed)      Continue amlodipine and lisinopril, patient will see pain management for back pain.  Continue pravastatin Follow up plan: Return in about 6 months (around 11/17/2018), or if symptoms worsen or fail to improve, for Cholesterol hypertension recheck.  Counseling provided for all of the vaccine components No orders of the defined types were placed in this encounter.   Caryl Pina, MD [facility] 05/17/2018, 11:03 AM

## 2018-05-31 ENCOUNTER — Other Ambulatory Visit: Payer: Self-pay | Admitting: Family Medicine

## 2018-06-07 ENCOUNTER — Other Ambulatory Visit: Payer: Self-pay | Admitting: Family Medicine

## 2018-06-18 ENCOUNTER — Other Ambulatory Visit: Payer: Self-pay | Admitting: Family Medicine

## 2018-06-22 ENCOUNTER — Encounter: Payer: Self-pay | Admitting: Family Medicine

## 2018-06-22 ENCOUNTER — Ambulatory Visit: Payer: BC Managed Care – PPO | Admitting: Family Medicine

## 2018-06-22 VITALS — BP 136/94 | HR 106 | Temp 98.2°F | Ht 65.0 in | Wt 218.0 lb

## 2018-06-22 DIAGNOSIS — R1013 Epigastric pain: Secondary | ICD-10-CM | POA: Diagnosis not present

## 2018-06-22 MED ORDER — PANTOPRAZOLE SODIUM 40 MG PO TBEC: 40.0000 mg | DELAYED_RELEASE_TABLET | Freq: Every day | ORAL | 0 refills | Status: DC

## 2018-06-22 NOTE — Patient Instructions (Signed)
We are checking you for a bacterial infection of the stomach that can increase acid production called H. pylori.  It may take several days before he received this result.  In the meantime, I have prescribed you pantoprazole to take once daily to reduce acid in the stomach.  Helicobacter Pylori Antibodies Test Why am I having this test? This is a blood test that looks for bacteria called Helicobacter pylori (H. pylori). H. pylori is a germ that can be found in the cells that line the stomach. Having high levels of H. pylori in your stomach puts you at risk for stomach ulcers and small bowel ulcers, long-term (chronic) inflammation of the lining of the stomach, or even ulcers that may occur in the canal that runs from the mouth to the stomach (esophagus). Presence of H. pylori can also increase your risk for stomach cancer if it is left untreated. Most people with H. pylori in their stomach bacteria have no symptoms. Your health care provider may ask you to have this test if you have symptoms of a stomach ulcer or small bowel ulcer, such as stomach pain before or after eating, heartburn, or nausea repeatedly after eating. What kind of sample is taken? A blood sample is required for this test. It is usually collected by inserting a needle into a vein or sticking a finger with a small needle. How do I prepare for this test? There is no preparation required for this test. What are the reference ranges? Reference ranges are considered healthy ranges established after testing a large group of healthy people. Reference ranges may vary among different people, labs, and hospitals. It is your responsibility to obtain your test results. Ask the lab or department performing the test when and how you will get your results. Your test results will be reported as positive, negative, or equivocal. Equivocal means that your results are neither positive nor negative. References ranges for these results are as  follows:  Less than or equal to 30 units/mL. This is negative.  Greater than or equal to 40 units/mL. This is positive.  30.01-39.99 units/mL. This is equivocal.  What do the results mean? Test results that are higher than normal may indicate numerous health conditions. These may include:  Short-term or long-term irritation of the stomach lining (gastritis).  Small bowel ulcer.  Stomach ulcer.  Stomach cancer.  Talk with your health care provider to discuss your results, treatment options, and if necessary, the need for more tests. Talk with your health care provider if you have any questions about your results. Talk with your health care provider to discuss your results, treatment options, and if necessary, the need for more tests. Talk with your health care provider if you have any questions about your results. This information is not intended to replace advice given to you by your health care provider. Make sure you discuss any questions you have with your health care provider. Document Released: 10/23/2004 Document Revised: 06/04/2016 Document Reviewed: 02/10/2014 Elsevier Interactive Patient Education  2018 [employer].

## 2018-06-22 NOTE — Progress Notes (Signed)
Subjective: CC: stomach cramps PCP: Dettinger, [name], MD NGE:[id number] Mazurek is a 57 y.o. female presenting to clinic today for:  1. Stomach cramps Patient reports several day history of epigastric cramping and pain.  She notes that it waxes and wanes in severity but seems to be worse with consuming acidic foods or drink like lemonade or heavy meals.  She denies any nausea, vomiting, melena, hematochezia, diarrhea or constipation.  She notes that bowel movements are normal.  She reports history of bacterial infection in her stomach as a young child and notes that this feels very similar.  Denies any recent travel outside of the country.  She was actually seen in urgent care recently and provided Zofran but she notes that she has not had any nausea and the Zofran only makes her sleepy.  She takes Zantac intermittently.   ROS: Per HPI  Allergies  Allergen Reactions  . Latex Hives    severe  . Vitamin E Other (See Comments)    Severe neuropathy  . Codeine Nausea And Vomiting    moderate   Past Medical History:  Diagnosis Date  . Allergy   . Blood transfusion without reported diagnosis 1982  . Chronic back pain   . Chronic kidney disease    history of kidney stones  . Endometriosis 2013  . Hyperlipidemia   . Hypertension   . Neuromuscular disorder (HCC)   . Neuropathy 2010    Current Outpatient Medications:  .  amLODipine (NORVASC) 5 MG tablet, Take 1 tablet (5 mg total) by mouth daily., Disp: 90 tablet, Rfl: 3 .  B-D 3CC LUER-LOK SYR 25GX1" 25G X 1" 3 ML MISC, 12use as directed every week, Disp: 12 each, Rfl: 1 .  Butalbital-Acetaminophen 50-300 MG TABS, 1 BID prn HA, Disp: 60 tablet, Rfl: 0 .  cyanocobalamin (,VITAMIN B-12,) 1000 MCG/ML injection, inject 1 cc intramuscularly weekly for 30 DAYS, Disp: , Rfl: 0 .  dicyclomine (BENTYL) 10 MG capsule, TAKE ONE CAPSULE BY MOUTH THREE TIMES DAILY BEFORE MEALS, Disp: 270 capsule, Rfl: 0 .  fluticasone (FLONASE) 50 MCG/ACT nasal  spray, Place 2 sprays into both nostrils daily., Disp: 18 g, Rfl: 5 .  gabapentin (NEURONTIN) 300 MG capsule, Take 300 mg by mouth 4 (four) times daily., Disp: , Rfl:  .  lisinopril (PRINIVIL,ZESTRIL) 30 MG tablet, TAKE 1 TABLET BY MOUTH EVERY DAY, Disp: 90 tablet, Rfl: 1 .  Meth-Hyo-M Bl-Na Phos-Ph Sal (URIBEL) 118 MG CAPS, Take 1 capsule (118 mg total) by mouth 4 (four) times daily as needed., Disp: 120 capsule, Rfl: 3 .  nystatin (MYCOSTATIN/NYSTOP) powder, Apply topically 4 (four) times daily., Disp: 15 g, Rfl: 5 .  oxycodone (ROXICODONE) 30 MG immediate release tablet, take 1 tablet by mouth every 6 hours if needed for 30 DAYS, Disp: , Rfl: 0 .  pravastatin (PRAVACHOL) 40 MG tablet, TAKE 1 TABLET BY MOUTH EVERY DAY, Disp: 90 tablet, Rfl: 1 .  tamsulosin (FLOMAX) 0.4 MG CAPS capsule, Take 1 capsule (0.4 mg total) by mouth daily as needed., Disp: 90 capsule, Rfl: 2 .  verapamil (CALAN) 120 MG tablet, TAKE 1 TABLET BY MOUTH EVERY DAY, Disp: 90 tablet, Rfl: 1 Social History   Socioeconomic History  . Marital status: Married    Spouse name: Not on file  . Number of children: Not on file  . Years of education: Not on file  . Highest education level: Not on file  Occupational History  . Not on file  Social Needs  .  Financial resource strain: Not on file  . Food insecurity:    Worry: Not on file    Inability: Not on file  . Transportation needs:    Medical: Not on file    Non-medical: Not on file  Tobacco Use  . Smoking status: Former Smoker    Packs/day: 1.00    Years: 20.00    Pack years: 20.00    Types: Cigarettes    Last attempt to quit: 11/16/1997    Years since quitting: 20.6  . Smokeless tobacco: Never Used  Substance and Sexual Activity  . Alcohol use: No    Frequency: Never  . Drug use: No  . Sexual activity: Never    Birth control/protection: Surgical    Comment: 1st intercourse 57 yo-More than 5 partners  Lifestyle  . Physical activity:    Days per week: Not on file     Minutes per session: Not on file  . Stress: Not on file  Relationships  . Social connections:    Talks on phone: Not on file    Gets together: Not on file    Attends religious service: Not on file    Active member of club or organization: Not on file    Attends meetings of clubs or organizations: Not on file    Relationship status: Not on file  . Intimate partner violence:    Fear of current or ex partner: Not on file    Emotionally abused: Not on file    Physically abused: Not on file    Forced sexual activity: Not on file  Other Topics Concern  . Not on file  Social History Narrative  . Not on file   Family History  Problem Relation Age of Onset  . Heart disease Mother   . Hyperlipidemia Mother   . Hypertension Mother   . Cancer Father        prostate  . Diabetes Father   . Multiple sclerosis Sister   . Learning disabilities Brother   . Multiple sclerosis Brother   . Cancer Brother        prostate  . Heart disease Maternal Grandfather   . Hyperlipidemia Sister   . Learning disabilities Brother   . Learning disabilities Brother     Objective: Office vital signs reviewed. BP (!) 136/94   Pulse (!) 106   Temp 98.2 F (36.8 C) (Oral)   Ht 5\' 5"  (1.651 m)   Wt 218 lb (98.9 kg)   BMI 36.28 kg/m   Physical Examination:  General: Awake, alert, obese, No acute distress HEENT: sclera white, MMM Cardio: regular rate Pulm: normal work of breathing on room air GI: soft, +epigastric TTP, no peritoneal signs. non-distended, bowel sounds present x4, no hepatomegaly, no splenomegaly, no masses  Assessment/ Plan: 57 y.o. female   1. Epigastric pain Physical exam is remarkable for epigastric tenderness to palpation.  She demonstrates no peritoneal signs on exam.  I suspect gastritis, possibly H. pylori induced given history of infection.  Will treat with PPI for now.  We will await lab results and treat with antibiotics pending this study.  Handout provided.  Follow-up  with PCP in 1 month for recheck. - H Pylori, IGM, IGG, IGA AB   Orders Placed This Encounter  Procedures  . H Pylori, IGM, IGG, IGA AB   Meds ordered this encounter  Medications  . pantoprazole (PROTONIX) 40 MG tablet    Sig: Take 1 tablet (40 mg total) by mouth daily.  Dispense:  30 tablet    Refill:  0     Raliegh Ip, DO [facility] [phone] '

## 2018-06-23 LAB — H PYLORI, IGM, IGG, IGA AB
H pylori, IgM Abs: <9 units (ref 0.0–8.9)
H. pylori, IgA Abs: <9 units (ref 0.0–8.9)
H. pylori, IgG AbS: 0.34 Index Value (ref 0.00–0.79)

## 2018-06-25 ENCOUNTER — Telehealth: Payer: Self-pay | Admitting: *Deleted

## 2018-06-25 ENCOUNTER — Other Ambulatory Visit: Payer: Self-pay | Admitting: Family Medicine

## 2018-06-25 DIAGNOSIS — R1013 Epigastric pain: Secondary | ICD-10-CM

## 2018-06-25 NOTE — Telephone Encounter (Signed)
It would be a urea breath test.  Apparently, we do not carry it, so may need to be done with gastroenterology.  We discussed that this would be the next step.

## 2018-06-25 NOTE — Telephone Encounter (Signed)
Pt came by and asked that we go ahead with this order. She is still having some bad cramping

## 2018-06-25 NOTE — Telephone Encounter (Signed)
Done

## 2018-06-25 NOTE — Telephone Encounter (Signed)
She states that she can not have contrast - not sure if that what this involves.  per lab - it can not be done here at our office.  She also has a 4 week appt - should we see her sooner.

## 2018-06-29 ENCOUNTER — Other Ambulatory Visit: Payer: Self-pay | Admitting: Family Medicine

## 2018-07-05 ENCOUNTER — Encounter: Payer: Self-pay | Admitting: Internal Medicine

## 2018-07-06 ENCOUNTER — Encounter: Payer: Self-pay | Admitting: Internal Medicine

## 2018-07-14 ENCOUNTER — Other Ambulatory Visit: Payer: Self-pay | Admitting: Family Medicine

## 2018-07-15 NOTE — Telephone Encounter (Signed)
Ov 07/21/18 

## 2018-07-21 ENCOUNTER — Ambulatory Visit: Payer: BC Managed Care – PPO | Admitting: Family Medicine

## 2018-08-11 ENCOUNTER — Other Ambulatory Visit: Payer: Self-pay | Admitting: Family Medicine

## 2018-08-21 ENCOUNTER — Other Ambulatory Visit: Payer: Self-pay | Admitting: Family Medicine

## 2018-08-25 ENCOUNTER — Other Ambulatory Visit: Payer: Self-pay | Admitting: Family Medicine

## 2018-09-01 MED ORDER — PANTOPRAZOLE SODIUM 40 MG PO TBEC: 40.0000 mg | DELAYED_RELEASE_TABLET | Freq: Every day | ORAL | 0 refills | Status: DC

## 2018-09-01 NOTE — Telephone Encounter (Signed)
Refill failed 08/26/18. resent

## 2018-09-01 NOTE — Addendum Note (Signed)
Addended by: Julious PayerHOLT, Tyyne Cliett D on: 09/01/2018 08:57 AM   Modules accepted: Orders

## 2018-09-17 ENCOUNTER — Other Ambulatory Visit: Payer: Self-pay | Admitting: Family Medicine

## 2018-10-12 ENCOUNTER — Ambulatory Visit: Payer: Self-pay | Admitting: Nurse Practitioner

## 2018-11-17 ENCOUNTER — Ambulatory Visit: Payer: BC Managed Care – PPO | Admitting: Family Medicine

## 2018-11-17 ENCOUNTER — Other Ambulatory Visit: Payer: Self-pay | Admitting: Family Medicine

## 2018-11-17 NOTE — Telephone Encounter (Signed)
Last seen 910/19 

## 2018-11-29 ENCOUNTER — Other Ambulatory Visit: Payer: Self-pay | Admitting: Family Medicine

## 2018-11-30 ENCOUNTER — Telehealth: Payer: Self-pay | Admitting: Family Medicine

## 2018-11-30 NOTE — Telephone Encounter (Signed)
appt scheduled Pt notified 

## 2018-12-01 ENCOUNTER — Ambulatory Visit: Payer: BC Managed Care – PPO | Admitting: Family Medicine

## 2018-12-01 ENCOUNTER — Encounter: Payer: Self-pay | Admitting: Family Medicine

## 2018-12-01 VITALS — BP 139/89 | HR 85 | Temp 97.7°F | Ht 65.0 in | Wt 221.4 lb

## 2018-12-01 DIAGNOSIS — N3000 Acute cystitis without hematuria: Secondary | ICD-10-CM | POA: Diagnosis not present

## 2018-12-01 LAB — URINALYSIS, COMPLETE
Bilirubin, UA: NEGATIVE
KETONES UA: NEGATIVE
LEUKOCYTES UA: NEGATIVE
Nitrite, UA: POSITIVE — AB
PROTEIN UA: NEGATIVE
Urobilinogen, Ur: 0.2 mg/dL (ref 0.2–1.0)
pH, UA: 6 (ref 5.0–7.5)

## 2018-12-01 LAB — MICROSCOPIC EXAMINATION

## 2018-12-01 MED ORDER — CEPHALEXIN 500 MG PO CAPS: 500.0000 mg | ORAL_CAPSULE | Freq: Four times a day (QID) | ORAL | 0 refills | Status: DC

## 2018-12-01 MED ORDER — URIBEL 118 MG PO CAPS: 1.0000 | ORAL_CAPSULE | Freq: Four times a day (QID) | ORAL | 3 refills | Status: DC | PRN

## 2018-12-01 NOTE — Progress Notes (Signed)
BP 139/89   Pulse 85   Temp 97.7 F (36.5 C) (Oral)   Ht 5\' 5"  (1.651 m)   Wt 221 lb 6.4 oz (100.4 kg)   BMI 36.84 kg/m    Subjective:    Patient ID: Bethany Ford, female    DOB: 16-Oct-1960, 58 y.o.   MRN: [id number]  HPI: Bethany Ford is a 58 y.o. female presenting on 12/01/2018 for Dysuria (x 2 days) and Urinary Frequency   HPI Patient comes in complaining of dysuria and pain at the introitus and urinary frequency that is been going on for the past 2 days.  She has been getting these about once a year and she felt like it started up over the past couple days.  She has been using Azo but has been out of her methylene blue that she has before because the pharmacy could not fill it.  She said that worked a lot better for would like to get it back if possible.  She denies any fevers but has had some chills.  She denies any flank pain abdominal pain but she is mostly lower pain at the introitus and frequency.  Relevant past medical, surgical, family and social history reviewed and updated as indicated. Interim medical history since our last visit reviewed. Allergies and medications reviewed and updated.  Review of Systems  Constitutional: Negative for chills and fever.  Eyes: Negative for visual disturbance.  Respiratory: Negative for chest tightness and shortness of breath.   Cardiovascular: Negative for chest pain and leg swelling.  Gastrointestinal: Negative for abdominal pain.  Genitourinary: Positive for dysuria, frequency and urgency. Negative for difficulty urinating, hematuria, vaginal bleeding, vaginal discharge and vaginal pain.  Musculoskeletal: Negative for back pain and gait problem.  Skin: Negative for rash.  Neurological: Negative for light-headedness and headaches.  Psychiatric/Behavioral: Negative for agitation and behavioral problems.  All other systems reviewed and are negative.   Per HPI unless specifically indicated above   Allergies as of 12/01/2018       Reactions   Latex Hives   severe   Vitamin E Other (See Comments)   Severe neuropathy   Codeine Nausea And Vomiting   moderate      Medication List       Accurate as of December 01, 2018 11:33 AM. Always use your most recent med list.        amLODipine 5 MG tablet Commonly known as:  NORVASC Take 1 tablet (5 mg total) by mouth daily.   B-D 3CC LUER-LOK SYR 25GX1" 25G X 1" 3 ML Misc Generic drug:  SYRINGE-NEEDLE (DISP) 3 ML 12use as directed every week   Butalbital-Acetaminophen 50-300 MG Tabs 1 BID prn HA   cyanocobalamin 1000 MCG/ML injection Commonly known as:  (VITAMIN B-12) inject 1 cc intramuscularly weekly for 30 DAYS   dicyclomine 10 MG capsule Commonly known as:  BENTYL TAKE 1 CAPSULE BY MOUTH THREE TIMES DAILY BEFORE MEALS   fluticasone 50 MCG/ACT nasal spray Commonly known as:  FLONASE SPRAY 2 SPRAYS INTO EACH NOSTRIL EVERY DAY   gabapentin 300 MG capsule Commonly known as:  NEURONTIN Take 300 mg by mouth 4 (four) times daily.   lisinopril 30 MG tablet Commonly known as:  PRINIVIL,ZESTRIL TAKE 1 TABLET BY MOUTH EVERY DAY   nystatin powder Commonly known as:  MYCOSTATIN/NYSTOP Apply topically 4 (four) times daily.   oxycodone 30 MG immediate release tablet Commonly known as:  ROXICODONE take 1 tablet by mouth every 6 hours if needed  for 30 DAYS   pantoprazole 40 MG tablet Commonly known as:  PROTONIX Take 1 tablet (40 mg total) by mouth daily.   pravastatin 40 MG tablet Commonly known as:  PRAVACHOL TAKE 1 TABLET BY MOUTH EVERY DAY   tamsulosin 0.4 MG Caps capsule Commonly known as:  FLOMAX Take 1 capsule (0.4 mg total) by mouth daily as needed.   URIBEL 118 MG Caps Take 1 capsule (118 mg total) by mouth 4 (four) times daily as needed.   verapamil 120 MG tablet Commonly known as:  CALAN TAKE 1 TABLET BY MOUTH EVERY DAY          Objective:    BP 139/89   Pulse 85   Temp 97.7 F (36.5 C) (Oral)   Ht 5\' 5"  (1.651 m)   Wt 221  lb 6.4 oz (100.4 kg)   BMI 36.84 kg/m   Wt Readings from Last 3 Encounters:  12/01/18 221 lb 6.4 oz (100.4 kg)  06/22/18 218 lb (98.9 kg)  05/17/18 223 lb 3.2 oz (101.2 kg)    Physical Exam Vitals signs and nursing note reviewed.  Constitutional:      General: She is not in acute distress.    Appearance: She is well-developed. She is not diaphoretic.  Eyes:     Conjunctiva/sclera: Conjunctivae normal.  Cardiovascular:     Rate and Rhythm: Normal rate and regular rhythm.     Heart sounds: Normal heart sounds. No murmur.  Pulmonary:     Effort: Pulmonary effort is normal. No respiratory distress.     Breath sounds: Normal breath sounds. No wheezing.  Abdominal:     General: Bowel sounds are normal. There is no distension.     Palpations: Abdomen is soft. Abdomen is not rigid. There is no mass.     Tenderness: There is no abdominal tenderness. There is no guarding or rebound.  Skin:    General: Skin is warm and dry.     Findings: No rash.  Neurological:     Mental Status: She is alert and oriented to person, place, and time.     Coordination: Coordination normal.  Psychiatric:        Behavior: Behavior normal.     Urinalysis: 0-5 WBCs, 3-10 RBCs, 0-10 epithelial cells, few bacteria, nitrite positive, 2+ blood, trace glucose    Assessment & Plan:   Problem List Items Addressed This Visit    None    Visit Diagnoses    Acute cystitis without hematuria    -  Primary   Relevant Medications   Meth-Hyo-M Bl-Na Phos-Ph Sal (URIBEL) 118 MG CAPS   Other Relevant Orders   Urinalysis, Complete   Urine Culture       Positive nitrites will treat like UTI will run a culture on her. Follow up plan: Return if symptoms worsen or fail to improve.  Counseling provided for all of the vaccine components Orders Placed This Encounter  Procedures  . Urinalysis, Complete    Arville Care, MD [facility] 12/01/2018, 11:33 AM

## 2018-12-03 ENCOUNTER — Encounter: Payer: Self-pay | Admitting: Family Medicine

## 2018-12-03 ENCOUNTER — Other Ambulatory Visit: Payer: Self-pay | Admitting: Family Medicine

## 2018-12-03 ENCOUNTER — Ambulatory Visit: Payer: BC Managed Care – PPO | Admitting: Family Medicine

## 2018-12-03 VITALS — BP 129/71 | HR 85 | Temp 97.1°F | Ht 65.0 in | Wt 225.4 lb

## 2018-12-03 DIAGNOSIS — I10 Essential (primary) hypertension: Secondary | ICD-10-CM | POA: Diagnosis not present

## 2018-12-03 DIAGNOSIS — E782 Mixed hyperlipidemia: Secondary | ICD-10-CM | POA: Diagnosis not present

## 2018-12-03 LAB — URINE CULTURE

## 2018-12-03 MED ORDER — AMLODIPINE BESYLATE 5 MG PO TABS: 5.0000 mg | ORAL_TABLET | Freq: Two times a day (BID) | ORAL | 3 refills | Status: DC

## 2018-12-03 MED ORDER — NYSTATIN 100000 UNIT/GM EX POWD: Freq: Two times a day (BID) | CUTANEOUS | 3 refills | Status: DC

## 2018-12-03 NOTE — Progress Notes (Signed)
BP 129/71   Pulse 85   Temp (!) 97.1 F (36.2 C) (Oral)   Ht '5\' 5"'$  (1.651 m)   Wt 102.2 kg   BMI 37.51 kg/m    Subjective:    Patient ID: Bethany Ford, female    DOB: [birth year], 58 y.o.   MRN: [id number]  HPI: Bethany Ford is a 58 y.o. female presenting on 12/03/2018 for Hypertension (6 month follow up) and Hyperlipidemia   HPI Pt is a 58 y/o female presenting for 6 month follow up for Hypertension and Hyperlipidemia.    Hypertension Pt is currently taking lisinopril and amlodipine for hypertension.  She is interested in cutting out her lisinopril, as she feels it is making her feel fatigued and that it may be affecting her mood.  Pt does not take her blood pressure at home, but denies headaches and dizziness. She denies any other issues with her blood pressure medications.  Hyperlipidemia Pt is taking pravastatin for hyperlipidemia. She denies side effects, and does not have a history of liver damage.  She states she desires to implement lifestyle changes to improve her cholesterol.   Relevant past medical, surgical, family and social history reviewed and updated as indicated. Interim medical history since our last visit reviewed. Allergies and medications reviewed and updated.  Review of Systems  Constitutional: Positive for fatigue.  Respiratory: Negative for shortness of breath.   Cardiovascular: Negative for chest pain and leg swelling.  Musculoskeletal: Positive for back pain.  Neurological: Negative for dizziness and headaches.  Psychiatric/Behavioral: Negative for agitation and behavioral problems.    Per HPI unless specifically indicated above   Allergies as of 12/03/2018      Reactions   Latex Hives   severe   Vitamin E Other (See Comments)   Severe neuropathy   Codeine Nausea And Vomiting   moderate      Medication List       Accurate as of December 03, 2018 11:16 AM. Always use your most recent med list.        amLODipine 5 MG tablet Commonly  known as:  NORVASC Take 1 tablet (5 mg total) by mouth 2 (two) times daily.   B-D 3CC LUER-LOK SYR 25GX1" 25G X 1" 3 ML Misc Generic drug:  SYRINGE-NEEDLE (DISP) 3 ML 12use as directed every week   Butalbital-Acetaminophen 50-300 MG Tabs 1 BID prn HA   cephALEXin 500 MG capsule Commonly known as:  KEFLEX Take 1 capsule (500 mg total) by mouth 4 (four) times daily.   cyanocobalamin 1000 MCG/ML injection Commonly known as:  (VITAMIN B-12) inject 1 cc intramuscularly weekly for 30 DAYS   cyclobenzaprine 10 MG tablet Commonly known as:  FLEXERIL Take 10 mg by mouth 3 (three) times daily as needed for muscle spasms.   dicyclomine 10 MG capsule Commonly known as:  BENTYL TAKE 1 CAPSULE BY MOUTH THREE TIMES DAILY BEFORE MEALS   fluticasone 50 MCG/ACT nasal spray Commonly known as:  FLONASE SPRAY 2 SPRAYS INTO EACH NOSTRIL EVERY DAY   gabapentin 300 MG capsule Commonly known as:  NEURONTIN Take 900 mg by mouth 4 (four) times daily.   lidocaine 5 % Commonly known as:  LIDODERM Place 1 patch onto the skin daily. Remove & Discard patch within 12 hours or as directed by MD   nystatin powder Commonly known as:  MYCOSTATIN/NYSTOP Apply topically 2 (two) times daily.   oxycodone 30 MG immediate release tablet Commonly known as:  ROXICODONE take 1 tablet by mouth  every 6 hours if needed for 30 DAYS   pantoprazole 40 MG tablet Commonly known as:  PROTONIX Take 1 tablet (40 mg total) by mouth daily.   pravastatin 40 MG tablet Commonly known as:  PRAVACHOL TAKE 1 TABLET BY MOUTH EVERY DAY   tamsulosin 0.4 MG Caps capsule Commonly known as:  FLOMAX Take 1 capsule (0.4 mg total) by mouth daily as needed.   URIBEL 118 MG Caps Take 1 capsule (118 mg total) by mouth 4 (four) times daily as needed.          Objective:    BP 129/71   Pulse 85   Temp (!) 97.1 F (36.2 C) (Oral)   Ht '5\' 5"'$  (1.651 m)   Wt 102.2 kg   BMI 37.51 kg/m   Wt Readings from Last 3 Encounters:   12/03/18 102.2 kg  12/01/18 100.4 kg  06/22/18 98.9 kg    Physical Exam Constitutional:      Appearance: Normal appearance.  Eyes:     Conjunctiva/sclera: Conjunctivae normal.     Pupils: Pupils are equal, round, and reactive to light.  Cardiovascular:     Rate and Rhythm: Normal rate and regular rhythm.     Heart sounds: Normal heart sounds.  Pulmonary:     Effort: Pulmonary effort is normal.     Breath sounds: Normal breath sounds. No wheezing.  Musculoskeletal:     Right lower leg: No edema.     Left lower leg: No edema.  Neurological:     Mental Status: She is alert.         Assessment & Plan:   Problem List Items Addressed This Visit      Cardiovascular and Mediastinum   Hypertension - Primary   Relevant Medications   amLODipine (NORVASC) 5 MG tablet   Other Relevant Orders   CBC with Differential/Platelet (Completed)   CMP14+EGFR (Completed)     Other   Hyperlipidemia   Relevant Medications   amLODipine (NORVASC) 5 MG tablet   Other Relevant Orders   Lipid panel (Completed)       Follow up plan: Return in about 6 months (around 06/03/2019), or if symptoms worsen or fail to improve, for htn and hld.  Discontinue lisinopril, and increase amplodipine to '5mg'$  PO twice daily, 1 tablet in the morning, 1 tablet in the evening.   Counseling provided for all of the vaccine components Orders Placed This Encounter  Procedures  . CBC with Differential/Platelet  . CMP14+EGFR  . Lipid panel    Johnney Killian PA-S [facility]  I was personally present for all components of the history, physical exam and/or medical decision making.  I agree with the documentation performed by the PA student and agree with assessment and plan above.  PA student was Johnney Killian. Caryl Pina, MD [facility] 12/08/2018, 9:01 PM

## 2018-12-04 LAB — CBC WITH DIFFERENTIAL/PLATELET
Basophils Absolute: 0 10*3/uL (ref 0.0–0.2)
Basos: 0 %
EOS (ABSOLUTE): 0.4 10*3/uL (ref 0.0–0.4)
Eos: 6 %
HEMATOCRIT: 38.9 % (ref 34.0–46.6)
HEMOGLOBIN: 13.3 g/dL (ref 11.1–15.9)
Immature Grans (Abs): 0 10*3/uL (ref 0.0–0.1)
Immature Granulocytes: 0 %
Lymphocytes Absolute: 2.9 10*3/uL (ref 0.7–3.1)
Lymphs: 37 %
MCH: 30.9 pg (ref 26.6–33.0)
MCHC: 34.2 g/dL (ref 31.5–35.7)
MCV: 90 fL (ref 79–97)
Monocytes Absolute: 0.7 10*3/uL (ref 0.1–0.9)
Monocytes: 9 %
Neutrophils Absolute: 3.7 10*3/uL (ref 1.4–7.0)
Neutrophils: 48 %
Platelets: 349 10*3/uL (ref 150–450)
RBC: 4.31 x10E6/uL (ref 3.77–5.28)
RDW: 12.2 % (ref 11.7–15.4)
WBC: 7.7 10*3/uL (ref 3.4–10.8)

## 2018-12-04 LAB — CMP14+EGFR
ALBUMIN: 4.4 g/dL (ref 3.8–4.9)
ALK PHOS: 108 IU/L (ref 39–117)
ALT: 20 IU/L (ref 0–32)
AST: 23 IU/L (ref 0–40)
Albumin/Globulin Ratio: 1.5 (ref 1.2–2.2)
BUN/Creatinine Ratio: 17 (ref 9–23)
BUN: 17 mg/dL (ref 6–24)
Bilirubin Total: 0.3 mg/dL (ref 0.0–1.2)
CO2: 24 mmol/L (ref 20–29)
Calcium: 9.6 mg/dL (ref 8.7–10.2)
Chloride: 105 mmol/L (ref 96–106)
Creatinine, Ser: 1.02 mg/dL — ABNORMAL HIGH (ref 0.57–1.00)
GFR calc Af Amer: 72 mL/min/{1.73_m2} (ref >59)
GFR calc non Af Amer: 62 mL/min/{1.73_m2} (ref >59)
GLUCOSE: 87 mg/dL (ref 65–99)
Globulin, Total: 3 g/dL (ref 1.5–4.5)
Potassium: 4 mmol/L (ref 3.5–5.2)
Sodium: 143 mmol/L (ref 134–144)
Total Protein: 7.4 g/dL (ref 6.0–8.5)

## 2018-12-04 LAB — LIPID PANEL
Chol/HDL Ratio: 3 ratio (ref 0.0–4.4)
Cholesterol, Total: 177 mg/dL (ref 100–199)
HDL: 60 mg/dL (ref >39)
LDL Calculated: 99 mg/dL (ref 0–99)
Triglycerides: 92 mg/dL (ref 0–149)
VLDL Cholesterol Cal: 18 mg/dL (ref 5–40)

## 2018-12-12 ENCOUNTER — Encounter: Payer: Self-pay | Admitting: Family Medicine

## 2018-12-16 ENCOUNTER — Other Ambulatory Visit: Payer: Self-pay | Admitting: Family Medicine

## 2018-12-17 ENCOUNTER — Ambulatory Visit: Payer: BC Managed Care – PPO | Admitting: Family Medicine

## 2018-12-17 ENCOUNTER — Encounter: Payer: Self-pay | Admitting: Family Medicine

## 2018-12-17 VITALS — BP 136/88 | HR 80 | Temp 97.6°F | Ht 65.0 in | Wt 224.0 lb

## 2018-12-17 DIAGNOSIS — J208 Acute bronchitis due to other specified organisms: Secondary | ICD-10-CM | POA: Diagnosis not present

## 2018-12-17 DIAGNOSIS — B9689 Other specified bacterial agents as the cause of diseases classified elsewhere: Secondary | ICD-10-CM

## 2018-12-17 MED ORDER — METHYLPREDNISOLONE ACETATE 40 MG/ML IJ SUSP
40.0000 mg | Freq: Once | INTRAMUSCULAR | Status: AC
  Administered 2018-12-17: 40 mg via INTRAMUSCULAR

## 2018-12-17 MED ORDER — AZITHROMYCIN 250 MG PO TABS: ORAL_TABLET | ORAL | 0 refills | Status: DC

## 2018-12-17 NOTE — Patient Instructions (Signed)
I have sent in a Zpak for your symptoms.  Continue home therapies.  We discussed tessalon but you declined this today.  You got a steroid shot today to help with inflammation.  Acute Bronchitis, Adult Acute bronchitis is when air tubes (bronchi) in the lungs suddenly get swollen. The condition can make it hard to breathe. It can also cause these symptoms:  A cough.  Coughing up clear, yellow, or Bewick mucus.  Wheezing.  Chest congestion.  Shortness of breath.  A fever.  Body aches.  Chills.  A sore throat. Follow these instructions at home:  Medicines  Take over-the-counter and prescription medicines only as told by your doctor.  If you were prescribed an antibiotic medicine, take it as told by your doctor. Do not stop taking the antibiotic even if you start to feel better. General instructions  Rest.  Drink enough fluids to keep your pee (urine) pale yellow.  Avoid smoking and secondhand smoke. If you smoke and you need help quitting, ask your doctor. Quitting will help your lungs heal faster.  Use an inhaler, cool mist vaporizer, or humidifier as told by your doctor.  Keep all follow-up visits as told by your doctor. This is important. How is this prevented? To lower your risk of getting this condition again:  Wash your hands often with soap and water. If you cannot use soap and water, use hand sanitizer.  Avoid contact with people who have cold symptoms.  Try not to touch your hands to your mouth, nose, or eyes.  Make sure to get the flu shot every year. Contact a doctor if:  Your symptoms do not get better in 2 weeks. Get help right away if:  You cough up blood.  You have chest pain.  You have very bad shortness of breath.  You become dehydrated.  You faint (pass out) or keep feeling like you are going to pass out.  You keep throwing up (vomiting).  You have a very bad headache.  Your fever or chills gets worse. This information is not  intended to replace advice given to you by your health care provider. Make sure you discuss any questions you have with your health care provider. Document Released: 03/17/2008 Document Revised: 05/13/2017 Document Reviewed: 03/19/2016 Elsevier Interactive Patient Education  2019 [employer].

## 2018-12-17 NOTE — Progress Notes (Signed)
Subjective: CC: URI PCP: Dettinger, [name], MD ZCH:YIFOYD [name] is a 58 y.o. female presenting to clinic today for:  1.  URI Patient reports onset of coughing, subjective fevers, chills and sinus drainage with yellow drainage that appears to be getting darker.  This is been ongoing for 10 days and seems to be getting worse despite use of Mucinex, "clear lung supplement", ibuprofen.  She also uses Flonase nasal spray.  Her last dose of ibuprofen was last evening.  She is status post treatment with Keflex for UTI about 2-1/2 weeks ago.  Denies any history of respiratory disease including asthma and COPD.  No shortness of breath, wheeze.  No recent travel.  No contact with persons concerning for coronavirus.   ROS: Per HPI  Allergies  Allergen Reactions  . Latex Hives    severe  . Vitamin E Other (See Comments)    Severe neuropathy  . Codeine Nausea And Vomiting    moderate   Past Medical History:  Diagnosis Date  . Allergy   . Blood transfusion without reported diagnosis 1982  . Chronic back pain   . Chronic kidney disease    history of kidney stones  . Endometriosis 2013  . Hyperlipidemia   . Hypertension   . Neuromuscular disorder (HCC)   . Neuropathy 2010    Current Outpatient Medications:  .  amLODipine (NORVASC) 5 MG tablet, Take 1 tablet (5 mg total) by mouth 2 (two) times daily. (Patient taking differently: Take 5 mg by mouth daily. ), Disp: 180 tablet, Rfl: 3 .  B-D 3CC LUER-LOK SYR 25GX1" 25G X 1" 3 ML MISC, 12use as directed every week, Disp: 12 each, Rfl: 1 .  Butalbital-Acetaminophen 50-300 MG TABS, 1 BID prn HA, Disp: 60 tablet, Rfl: 0 .  cyanocobalamin (,VITAMIN B-12,) 1000 MCG/ML injection, inject 1 cc intramuscularly weekly for 30 DAYS, Disp: , Rfl: 0 .  cyclobenzaprine (FLEXERIL) 10 MG tablet, Take 10 mg by mouth 3 (three) times daily as needed for muscle spasms., Disp: , Rfl:  .  dicyclomine (BENTYL) 10 MG capsule, TAKE 1 CAPSULE BY MOUTH THREE TIMES  DAILY BEFORE MEALS, Disp: 90 capsule, Rfl: 0 .  fluticasone (FLONASE) 50 MCG/ACT nasal spray, SPRAY 2 SPRAYS INTO EACH NOSTRIL EVERY DAY, Disp: 48 g, Rfl: 1 .  gabapentin (NEURONTIN) 300 MG capsule, Take 900 mg by mouth 4 (four) times daily. , Disp: , Rfl:  .  lidocaine (LIDODERM) 5 %, Place 1 patch onto the skin daily. Remove & Discard patch within 12 hours or as directed by MD, Disp: , Rfl:  .  lisinopril (PRINIVIL,ZESTRIL) 30 MG tablet, Take 30 mg by mouth daily., Disp: , Rfl:  .  Meth-Hyo-M Bl-Na Phos-Ph Sal (URIBEL) 118 MG CAPS, Take 1 capsule (118 mg total) by mouth 4 (four) times daily as needed., Disp: 120 capsule, Rfl: 3 .  nystatin (MYCOSTATIN/NYSTOP) powder, Apply topically 2 (two) times daily., Disp: 60 g, Rfl: 3 .  oxycodone (ROXICODONE) 30 MG immediate release tablet, take 1 tablet by mouth every 6 hours if needed for 30 DAYS, Disp: , Rfl: 0 .  pantoprazole (PROTONIX) 40 MG tablet, Take 1 tablet (40 mg total) by mouth daily., Disp: 90 tablet, Rfl: 0 .  pravastatin (PRAVACHOL) 40 MG tablet, TAKE 1 TABLET BY MOUTH EVERY DAY, Disp: 90 tablet, Rfl: 1 .  tamsulosin (FLOMAX) 0.4 MG CAPS capsule, Take 1 capsule (0.4 mg total) by mouth daily as needed., Disp: 90 capsule, Rfl: 2 Social History  Socioeconomic History  . Marital status: Married    Spouse name: Not on file  . Number of children: Not on file  . Years of education: Not on file  . Highest education level: Not on file  Occupational History  . Not on file  Social Needs  . Financial resource strain: Not on file  . Food insecurity:    Worry: Not on file    Inability: Not on file  . Transportation needs:    Medical: Not on file    Non-medical: Not on file  Tobacco Use  . Smoking status: Former Smoker    Packs/day: 1.00    Years: 20.00    Pack years: 20.00    Types: Cigarettes    Last attempt to quit: 11/16/1997    Years since quitting: 21.0  . Smokeless tobacco: Never Used  Substance and Sexual Activity  . Alcohol  use: No    Frequency: Never  . Drug use: No  . Sexual activity: Never    Birth control/protection: Surgical    Comment: 1st intercourse 58 yo-More than 5 partners  Lifestyle  . Physical activity:    Days per week: Not on file    Minutes per session: Not on file  . Stress: Not on file  Relationships  . Social connections:    Talks on phone: Not on file    Gets together: Not on file    Attends religious service: Not on file    Active member of club or organization: Not on file    Attends meetings of clubs or organizations: Not on file    Relationship status: Not on file  . Intimate partner violence:    Fear of current or ex partner: Not on file    Emotionally abused: Not on file    Physically abused: Not on file    Forced sexual activity: Not on file  Other Topics Concern  . Not on file  Social History Narrative  . Not on file   Family History  Problem Relation Age of Onset  . Heart disease Mother   . Hyperlipidemia Mother   . Hypertension Mother   . Cancer Father        prostate  . Diabetes Father   . Multiple sclerosis Sister   . Learning disabilities Brother   . Multiple sclerosis Brother   . Cancer Brother        prostate  . Heart disease Maternal Grandfather   . Hyperlipidemia Sister   . Learning disabilities Brother   . Learning disabilities Brother     Objective: Office vital signs reviewed. BP 136/88   Pulse 80   Temp 97.6 F (36.4 C) (Oral)   Ht  (1.651 m)   Wt 224 lb (101.6 kg)   SpO2 96%   BMI 37.28 kg/m   Physical Examination:  General: Awake, alert, appears ill, No acute distress HEENT: Normal    Neck: No masses palpated. No lymphadenopathy    Ears: Tympanic membranes intact, normal light reflex, no erythema, no bulging    Eyes: PERRLA, extraocular membranes intact, sclera white    Nose: nasal turbinates moist, clear nasal discharge    Throat: moist mucus membranes, no erythema, no tonsillar exudate.  Airway is patent Cardio: regular  rate and rhythm, S1S2 heard, no murmurs appreciated Pulm: clear to auscultation bilaterally, no wheezes, rhonchi or rales; normal work of breathing on room air; harsh coughing frequently during exam  Assessment/ Plan: 58 y.o. female   1.  Acute bacterial bronchitis Given worsening symptoms and duration of symptoms, will empirically treat for acute bacterial bronchitis with a Z-Pak, which she has tolerated without difficulty in the past.  She was also given a dose of Depo-Medrol here in office.  Prefer to avoid opioid-containing cough syrups given concomitant use of oxycodone chronically.  Tessalon Perles were offered but patient declined this.  She will continue OTC therapies.  Reasons for return emergent evaluation emergency department discussed.  She was good understanding of follow-up PRN. - methylPREDNISolone acetate (DEPO-MEDROL) injection 40 mg - azithromycin (ZITHROMAX) 250 MG tablet; Take 2 tablets today, then take 1 tablet daily until gone.  Dispense: 6 tablet; Refill: 0   No orders of the defined types were placed in this encounter.  Meds ordered this encounter  Medications  . methylPREDNISolone acetate (DEPO-MEDROL) injection 40 mg  . azithromycin (ZITHROMAX) 250 MG tablet    Sig: Take 2 tablets today, then take 1 tablet daily until gone.    Dispense:  6 tablet    Refill:  0     Bethany Zinger Hulen Skains, DO [facility] [phone]

## 2019-01-04 ENCOUNTER — Other Ambulatory Visit: Payer: Self-pay | Admitting: Family Medicine

## 2019-02-17 ENCOUNTER — Other Ambulatory Visit: Payer: Self-pay | Admitting: Family Medicine

## 2019-02-23 ENCOUNTER — Other Ambulatory Visit: Payer: Self-pay | Admitting: *Deleted

## 2019-03-28 ENCOUNTER — Other Ambulatory Visit: Payer: Self-pay | Admitting: Family Medicine

## 2019-05-27 ENCOUNTER — Other Ambulatory Visit: Payer: Self-pay | Admitting: Family Medicine

## 2019-05-27 NOTE — Telephone Encounter (Signed)
You are right, I do not have this on her current active list so we need to verify with her that she is still currently taking it before we refill.  If she is still taking it and believes she is still supposed to be taking it then go ahead and refill it for her.

## 2019-05-27 NOTE — Telephone Encounter (Signed)
[id number] 8/14 

## 2019-05-28 ENCOUNTER — Other Ambulatory Visit: Payer: Self-pay | Admitting: Family Medicine

## 2019-06-06 ENCOUNTER — Other Ambulatory Visit: Payer: Self-pay

## 2019-06-06 ENCOUNTER — Ambulatory Visit: Payer: BC Managed Care – PPO | Admitting: Family Medicine

## 2019-06-06 ENCOUNTER — Encounter: Payer: Self-pay | Admitting: Family Medicine

## 2019-06-06 VITALS — BP 122/79 | HR 74 | Temp 98.4°F | Ht 65.0 in | Wt 224.4 lb

## 2019-06-06 DIAGNOSIS — N200 Calculus of kidney: Secondary | ICD-10-CM | POA: Insufficient documentation

## 2019-06-06 DIAGNOSIS — E782 Mixed hyperlipidemia: Secondary | ICD-10-CM | POA: Diagnosis not present

## 2019-06-06 DIAGNOSIS — K582 Mixed irritable bowel syndrome: Secondary | ICD-10-CM | POA: Diagnosis not present

## 2019-06-06 DIAGNOSIS — I10 Essential (primary) hypertension: Secondary | ICD-10-CM | POA: Diagnosis not present

## 2019-06-06 MED ORDER — PRAVASTATIN SODIUM 40 MG PO TABS: 40.0000 mg | ORAL_TABLET | Freq: Every day | ORAL | 3 refills | Status: DC

## 2019-06-06 MED ORDER — DICYCLOMINE HCL 10 MG PO CAPS: 10.0000 mg | ORAL_CAPSULE | Freq: Three times a day (TID) | ORAL | 3 refills | Status: DC

## 2019-06-06 MED ORDER — AMLODIPINE BESYLATE 5 MG PO TABS: 5.0000 mg | ORAL_TABLET | Freq: Every day | ORAL | 3 refills | Status: DC

## 2019-06-06 MED ORDER — TAMSULOSIN HCL 0.4 MG PO CAPS: 0.4000 mg | ORAL_CAPSULE | Freq: Every day | ORAL | 3 refills | Status: DC | PRN

## 2019-06-06 MED ORDER — LISINOPRIL 30 MG PO TABS: 30.0000 mg | ORAL_TABLET | Freq: Every day | ORAL | 3 refills | Status: DC

## 2019-06-06 NOTE — Progress Notes (Signed)
BP 122/79   Pulse 74   Temp 98.4 F (36.9 C) (Temporal)   Ht '5\' 5"'$  (1.651 m)   Wt 224 lb 6.4 oz (101.8 kg)   BMI 37.34 kg/m    Subjective:   Patient ID: Bethany Ford, female    DOB: 06/22/1961, 58 y.o.   MRN: [id number]  HPI: Bethany Ford is a 58 y.o. female presenting on 06/06/2019 for Hypertension (6 month follow up)   HPI Hypertension Patient is currently on amlodipine and lisinopril, and their blood pressure today is 122/79. Patient denies any lightheadedness or dizziness. Patient denies headaches, blurred vision, chest pains, shortness of breath, or weakness. Denies any side effects from medication and is content with current medication.   Hyperlipidemia Patient is coming in for recheck of his hyperlipidemia. The patient is currently taking a statin. They deny any issues with myalgias or history of liver damage from it. They deny any focal numbness or weakness or chest pain.   Patient has a history of renal stones and takes Flomax helping flow as needed.  She uses a refill for this.  She says she does not have any issues currently.  Patient has intermittent constipation and diarrhea with irritable bowel symptoms.  She has been on dicyclomine for some time but says it was reduced from 4 times to 3 times a day and would like to go back up to 4 times a day because she felt like it was doing better.  She has mild intermittent symptoms.  She says nothing severe and nothing today.  Relevant past medical, surgical, family and social history reviewed and updated as indicated. Interim medical history since our last visit reviewed. Allergies and medications reviewed and updated.  Review of Systems  Constitutional: Negative for chills and fever.  Eyes: Negative for visual disturbance.  Respiratory: Negative for chest tightness and shortness of breath.   Cardiovascular: Negative for chest pain and leg swelling.  Gastrointestinal: Negative for abdominal pain.  Musculoskeletal: Negative  for back pain and gait problem.  Skin: Negative for rash.  Neurological: Negative for light-headedness and headaches.  Psychiatric/Behavioral: Negative for agitation and behavioral problems.  All other systems reviewed and are negative.   Per HPI unless specifically indicated above   Allergies as of 06/06/2019      Reactions   Latex Hives   severe   Vitamin E Other (See Comments)   Severe neuropathy   Codeine Nausea And Vomiting   moderate      Medication List       Accurate as of June 06, 2019  8:47 AM. If you have any questions, ask your nurse or doctor.        STOP taking these medications   azithromycin 250 MG tablet Commonly known as: ZITHROMAX Stopped by: Fransisca Kaufmann Dettinger, MD   pantoprazole 40 MG tablet Commonly known as: PROTONIX Stopped by: Fransisca Kaufmann Dettinger, MD     TAKE these medications   amLODipine 5 MG tablet Commonly known as: NORVASC Take 1 tablet (5 mg total) by mouth daily.   B-D 3CC LUER-LOK SYR 25GX1" 25G X 1" 3 ML Misc Generic drug: SYRINGE-NEEDLE (DISP) 3 ML 12use as directed every week   Butalbital-Acetaminophen 50-300 MG Tabs 1 BID prn HA   cyanocobalamin 1000 MCG/ML injection Commonly known as: (VITAMIN B-12) inject 1 cc intramuscularly weekly for 30 DAYS   cyclobenzaprine 10 MG tablet Commonly known as: FLEXERIL Take 10 mg by mouth 3 (three) times daily as needed for muscle spasms.  dicyclomine 10 MG capsule Commonly known as: BENTYL Take 1 capsule (10 mg total) by mouth 4 (four) times daily -  before meals and at bedtime. What changed: See the new instructions. Changed by: Fransisca Kaufmann Dettinger, MD   fluticasone 50 MCG/ACT nasal spray Commonly known as: FLONASE SPRAY 2 SPRAYS INTO EACH NOSTRIL EVERY DAY   gabapentin 300 MG capsule Commonly known as: NEURONTIN Take 900 mg by mouth 4 (four) times daily.   lidocaine 5 % Commonly known as: LIDODERM Place 1 patch onto the skin daily. Remove & Discard patch within 12 hours  or as directed by MD   lisinopril 30 MG tablet Commonly known as: ZESTRIL Take 1 tablet (30 mg total) by mouth daily.   nystatin powder Commonly known as: MYCOSTATIN/NYSTOP Apply topically 2 (two) times daily.   pravastatin 40 MG tablet Commonly known as: PRAVACHOL Take 1 tablet (40 mg total) by mouth daily.   tamsulosin 0.4 MG Caps capsule Commonly known as: FLOMAX Take 1 capsule (0.4 mg total) by mouth daily as needed.   Uribel 118 MG Caps Take 1 capsule (118 mg total) by mouth 4 (four) times daily as needed.   verapamil 120 MG tablet Commonly known as: CALAN TAKE 1 TABLET BY MOUTH EVERY DAY        Objective:   BP 122/79   Pulse 74   Temp 98.4 F (36.9 C) (Temporal)   Ht '5\' 5"'$  (1.651 m)   Wt 224 lb 6.4 oz (101.8 kg)   BMI 37.34 kg/m   Wt Readings from Last 3 Encounters:  06/06/19 224 lb 6.4 oz (101.8 kg)  12/17/18 224 lb (101.6 kg)  12/03/18 225 lb 6.4 oz (102.2 kg)    Physical Exam Vitals signs and nursing note reviewed.  Constitutional:      General: She is not in acute distress.    Appearance: She is well-developed. She is not diaphoretic.  Eyes:     Conjunctiva/sclera: Conjunctivae normal.  Cardiovascular:     Rate and Rhythm: Normal rate and regular rhythm.     Heart sounds: Normal heart sounds. No murmur.  Pulmonary:     Effort: Pulmonary effort is normal. No respiratory distress.     Breath sounds: Normal breath sounds. No wheezing.  Abdominal:     General: Abdomen is flat. Bowel sounds are normal. There is no distension.     Tenderness: There is no abdominal tenderness. There is no guarding or rebound.     Hernia: No hernia is present.  Musculoskeletal: Normal range of motion.        General: No tenderness.  Skin:    General: Skin is warm and dry.     Findings: No rash.  Neurological:     Mental Status: She is alert and oriented to person, place, and time.     Coordination: Coordination normal.  Psychiatric:        Behavior: Behavior  normal.     Assessment & Plan:   Problem List Items Addressed This Visit      Cardiovascular and Mediastinum   Hypertension - Primary   Relevant Medications   lisinopril (ZESTRIL) 30 MG tablet   pravastatin (PRAVACHOL) 40 MG tablet   amLODipine (NORVASC) 5 MG tablet   Other Relevant Orders   CMP14+EGFR (Completed)     Genitourinary   Renal stones   Relevant Medications   tamsulosin (FLOMAX) 0.4 MG CAPS capsule   Other Relevant Orders   CBC with Differential/Platelet (Completed)  Other   Hyperlipidemia   Relevant Medications   lisinopril (ZESTRIL) 30 MG tablet   pravastatin (PRAVACHOL) 40 MG tablet   amLODipine (NORVASC) 5 MG tablet   Other Relevant Orders   Lipid panel (Completed)    Other Visit Diagnoses    Irritable bowel syndrome with both constipation and diarrhea       Relevant Medications   dicyclomine (BENTYL) 10 MG capsule      Increase Bentyl to 4 times a day, continue Flomax as needed for renal stones when they pop up, she has them about every month. She is only taking amlodipine once a day and continue lisinopril 30 and Pravachol.  No other medication changes and will check blood work today. Follow up plan: Return in about 6 months (around 12/07/2019), or if symptoms worsen or fail to improve, for Hypertension and cholesterol recheck.  Counseling provided for all of the vaccine components Orders Placed This Encounter  Procedures  . CBC with Differential/Platelet  . CMP14+EGFR  . Lipid panel    Caryl Pina, MD [facility] 06/06/2019, 8:47 AM

## 2019-06-07 LAB — CMP14+EGFR
ALT: 22 IU/L (ref 0–32)
AST: 23 IU/L (ref 0–40)
Albumin/Globulin Ratio: 1.6 (ref 1.2–2.2)
Albumin: 4.3 g/dL (ref 3.8–4.9)
Alkaline Phosphatase: 96 IU/L (ref 39–117)
BUN/Creatinine Ratio: 21 (ref 9–23)
BUN: 20 mg/dL (ref 6–24)
Bilirubin Total: 0.2 mg/dL (ref 0.0–1.2)
CO2: 25 mmol/L (ref 20–29)
Calcium: 9.5 mg/dL (ref 8.7–10.2)
Chloride: 103 mmol/L (ref 96–106)
Creatinine, Ser: 0.97 mg/dL (ref 0.57–1.00)
GFR calc Af Amer: 76 mL/min/{1.73_m2} (ref >59)
GFR calc non Af Amer: 66 mL/min/{1.73_m2} (ref >59)
Globulin, Total: 2.7 g/dL (ref 1.5–4.5)
Glucose: 91 mg/dL (ref 65–99)
Potassium: 4 mmol/L (ref 3.5–5.2)
Sodium: 140 mmol/L (ref 134–144)
Total Protein: 7 g/dL (ref 6.0–8.5)

## 2019-06-07 LAB — CBC WITH DIFFERENTIAL/PLATELET
Basophils Absolute: 0 10*3/uL (ref 0.0–0.2)
Basos: 0 %
EOS (ABSOLUTE): 0.4 10*3/uL (ref 0.0–0.4)
Eos: 5 %
Hematocrit: 38.6 % (ref 34.0–46.6)
Hemoglobin: 12.7 g/dL (ref 11.1–15.9)
Immature Grans (Abs): 0 10*3/uL (ref 0.0–0.1)
Immature Granulocytes: 0 %
Lymphocytes Absolute: 2.8 10*3/uL (ref 0.7–3.1)
Lymphs: 37 %
MCH: 30.4 pg (ref 26.6–33.0)
MCHC: 32.9 g/dL (ref 31.5–35.7)
MCV: 92 fL (ref 79–97)
Monocytes Absolute: 0.8 10*3/uL (ref 0.1–0.9)
Monocytes: 10 %
Neutrophils Absolute: 3.6 10*3/uL (ref 1.4–7.0)
Neutrophils: 48 %
Platelets: 295 10*3/uL (ref 150–450)
RBC: 4.18 x10E6/uL (ref 3.77–5.28)
RDW: 12.6 % (ref 11.7–15.4)
WBC: 7.6 10*3/uL (ref 3.4–10.8)

## 2019-06-07 LAB — LIPID PANEL
Chol/HDL Ratio: 3 ratio (ref 0.0–4.4)
Cholesterol, Total: 166 mg/dL (ref 100–199)
HDL: 56 mg/dL (ref >39)
LDL Calculated: 83 mg/dL (ref 0–99)
Triglycerides: 136 mg/dL (ref 0–149)
VLDL Cholesterol Cal: 27 mg/dL (ref 5–40)

## 2019-06-27 ENCOUNTER — Other Ambulatory Visit: Payer: Self-pay | Admitting: Family Medicine

## 2019-06-27 DIAGNOSIS — K582 Mixed irritable bowel syndrome: Secondary | ICD-10-CM

## 2019-07-06 ENCOUNTER — Encounter: Payer: Self-pay | Admitting: Gynecology

## 2019-08-02 ENCOUNTER — Ambulatory Visit: Payer: BC Managed Care – PPO | Admitting: Nurse Practitioner

## 2019-08-02 ENCOUNTER — Other Ambulatory Visit: Payer: Self-pay

## 2019-08-02 ENCOUNTER — Encounter: Payer: Self-pay | Admitting: Nurse Practitioner

## 2019-08-02 VITALS — BP 144/94 | HR 86 | Temp 99.1°F | Resp 20 | Ht 65.0 in | Wt 225.0 lb

## 2019-08-02 DIAGNOSIS — N3001 Acute cystitis with hematuria: Secondary | ICD-10-CM

## 2019-08-02 DIAGNOSIS — R300 Dysuria: Secondary | ICD-10-CM

## 2019-08-02 LAB — URINALYSIS, COMPLETE
Bilirubin, UA: NEGATIVE
Glucose, UA: NEGATIVE
Ketones, UA: NEGATIVE
Nitrite, UA: NEGATIVE
Protein,UA: NEGATIVE
Specific Gravity, UA: 1.01 (ref 1.005–1.030)
Urobilinogen, Ur: 0.2 mg/dL (ref 0.2–1.0)
pH, UA: 6 (ref 5.0–7.5)

## 2019-08-02 LAB — MICROSCOPIC EXAMINATION

## 2019-08-02 MED ORDER — SULFAMETHOXAZOLE-TRIMETHOPRIM 800-160 MG PO TABS: 1.0000 | ORAL_TABLET | Freq: Two times a day (BID) | ORAL | 0 refills | Status: DC

## 2019-08-02 NOTE — Progress Notes (Signed)
Subjective:    Patient ID: Bethany Ford, female    DOB: 04-09-1961, 58 y.o.   MRN: [id number]   Chief Complaint: cloudy urine   HPI Patient come sin today c/o stating that she was having suprapubic pain. Was having trouble passing her urine. Burns when she goes to restroom now. Her urine is very cloudy. She feels like she passed a kidney stone and she always gets a UTI after passing a stone.   Review of Systems  Constitutional: Negative for activity change and appetite change.  HENT: Negative.   Eyes: Negative for pain.  Respiratory: Negative for shortness of breath.   Cardiovascular: Negative for chest pain, palpitations and leg swelling.  Gastrointestinal: Negative for abdominal pain.  Endocrine: Negative for polydipsia.  Genitourinary: Positive for dysuria, frequency, pelvic pain and urgency.  Skin: Negative for rash.  Neurological: Negative for dizziness, weakness and headaches.  Hematological: Does not bruise/bleed easily.  Psychiatric/Behavioral: Negative.   All other systems reviewed and are negative.      Objective:   Physical Exam Vitals signs and nursing note reviewed.  Constitutional:      General: She is not in acute distress.    Appearance: Normal appearance. She is well-developed.  HENT:     Head: Normocephalic.     Nose: Nose normal.  Eyes:     Pupils: Pupils are equal, round, and reactive to light.  Neck:     Musculoskeletal: Normal range of motion and neck supple.     Vascular: No carotid bruit or JVD.  Cardiovascular:     Rate and Rhythm: Normal rate and regular rhythm.     Heart sounds: Normal heart sounds.  Pulmonary:     Effort: Pulmonary effort is normal. No respiratory distress.     Breath sounds: Normal breath sounds. No wheezing or rales.  Chest:     Chest wall: No tenderness.  Abdominal:     General: Bowel sounds are normal. There is no distension or abdominal bruit.     Palpations: Abdomen is soft. There is no hepatomegaly,  splenomegaly, mass or pulsatile mass.     Tenderness: There is no abdominal tenderness. There is no right CVA tenderness or left CVA tenderness.  Musculoskeletal: Normal range of motion.  Lymphadenopathy:     Cervical: No cervical adenopathy.  Skin:    General: Skin is warm and dry.  Neurological:     Mental Status: She is alert and oriented to person, place, and time.     Deep Tendon Reflexes: Reflexes are normal and symmetric.  Psychiatric:        Behavior: Behavior normal.        Thought Content: Thought content normal.        Judgment: Judgment normal.    BP (!) 144/94   Pulse 86   Temp 99.1 F (37.3 C) (Temporal)   Resp 20   Ht 5\' 5"  (1.651 m)   Wt 225 lb (102.1 kg)   SpO2 95%   BMI 37.44 kg/m         Assessment & Plan:  Bethany Ford in today with chief complaint of cloudy urine   1. Dysuria - Urinalysis, Complete  2. Acute cystitis with hematuria Take medication as prescribe Cotton underwear Take shower not bath Cranberry juice, yogurt Force fluids AZO over the counter X2 days Culture pending RTO prn  Meds ordered this encounter  Medications  . sulfamethoxazole-trimethoprim (BACTRIM DS) 800-160 MG tablet    Sig: Take 1 tablet by mouth  2 (two) times daily.    Dispense:  10 tablet    Refill:  0    Order Specific Question:   Supervising Provider    Answer:   Nils Pyle [[id number]]   Mary-Margaret Daphine Deutscher, FNP

## 2019-08-02 NOTE — Patient Instructions (Signed)
Urinary Tract Infection, Adult A urinary tract infection (UTI) is an infection of any part of the urinary tract. The urinary tract includes:  The kidneys.  The ureters.  The bladder.  The urethra. These organs make, store, and get rid of pee (urine) in the body. What are the causes? This is caused by germs (bacteria) in your genital area. These germs grow and cause swelling (inflammation) of your urinary tract. What increases the risk? You are more likely to develop this condition if:  You have a small, thin tube (catheter) to drain pee.  You cannot control when you pee or poop (incontinence).  You are female, and: ? You use these methods to prevent pregnancy: ? A medicine that kills sperm (spermicide). ? A device that blocks sperm (diaphragm). ? You have low levels of a female hormone (estrogen). ? You are pregnant.  You have genes that add to your risk.  You are sexually active.  You take antibiotic medicines.  You have trouble peeing because of: ? A prostate that is bigger than normal, if you are female. ? A blockage in the part of your body that drains pee from the bladder (urethra). ? A kidney stone. ? A nerve condition that affects your bladder (neurogenic bladder). ? Not getting enough to drink. ? Not peeing often enough.  You have other conditions, such as: ? Diabetes. ? A weak disease-fighting system (immune system). ? Sickle cell disease. ? Gout. ? Injury of the spine. What are the signs or symptoms? Symptoms of this condition include:  Needing to pee right away (urgently).  Peeing often.  Peeing small amounts often.  Pain or burning when peeing.  Blood in the pee.  Pee that smells bad or not like normal.  Trouble peeing.  Pee that is cloudy.  Fluid coming from the vagina, if you are female.  Pain in the belly or lower back. Other symptoms include:  Throwing up (vomiting).  No urge to eat.  Feeling mixed up (confused).  Being tired  and grouchy (irritable).  A fever.  Watery poop (diarrhea). How is this treated? This condition may be treated with:  Antibiotic medicine.  Other medicines.  Drinking enough water. Follow these instructions at home:  Medicines  Take over-the-counter and prescription medicines only as told by your doctor.  If you were prescribed an antibiotic medicine, take it as told by your doctor. Do not stop taking it even if you start to feel better. General instructions  Make sure you: ? Pee until your bladder is empty. ? Do not hold pee for a long time. ? Empty your bladder after sex. ? Wipe from front to back after pooping if you are a female. Use each tissue one time when you wipe.  Drink enough fluid to keep your pee pale yellow.  Keep all follow-up visits as told by your doctor. This is important. Contact a doctor if:  You do not get better after 1-2 days.  Your symptoms go away and then come back. Get help right away if:  You have very bad back pain.  You have very bad pain in your lower belly.  You have a fever.  You are sick to your stomach (nauseous).  You are throwing up. Summary  A urinary tract infection (UTI) is an infection of any part of the urinary tract.  This condition is caused by germs in your genital area.  There are many risk factors for a UTI. These include having a small, thin   tube to drain pee and not being able to control when you pee or poop.  Treatment includes antibiotic medicines for germs.  Drink enough fluid to keep your pee pale yellow. This information is not intended to replace advice given to you by your health care provider. Make sure you discuss any questions you have with your health care provider. Document Released: 03/17/2008 Document Revised: 09/16/2018 Document Reviewed: 04/08/2018 Elsevier Patient Education  2020 [employer]  

## 2019-08-04 LAB — URINE CULTURE

## 2019-08-20 ENCOUNTER — Other Ambulatory Visit: Payer: Self-pay | Admitting: Family Medicine

## 2019-08-20 DIAGNOSIS — I10 Essential (primary) hypertension: Secondary | ICD-10-CM

## 2019-08-24 ENCOUNTER — Other Ambulatory Visit: Payer: Self-pay | Admitting: Family Medicine

## 2019-08-24 DIAGNOSIS — K582 Mixed irritable bowel syndrome: Secondary | ICD-10-CM

## 2019-09-23 ENCOUNTER — Encounter: Payer: Self-pay | Admitting: Family

## 2019-09-23 ENCOUNTER — Other Ambulatory Visit: Payer: Self-pay

## 2019-09-23 ENCOUNTER — Ambulatory Visit (INDEPENDENT_AMBULATORY_CARE_PROVIDER_SITE_OTHER): Payer: BC Managed Care – PPO | Admitting: Family

## 2019-09-23 DIAGNOSIS — R399 Unspecified symptoms and signs involving the genitourinary system: Secondary | ICD-10-CM | POA: Diagnosis not present

## 2019-09-23 MED ORDER — CEPHALEXIN 500 MG PO CAPS: 500.0000 mg | ORAL_CAPSULE | Freq: Two times a day (BID) | ORAL | 0 refills | Status: DC

## 2019-09-23 NOTE — Progress Notes (Signed)
   Virtual Visit via telephone Note Due to COVID-19 pandemic this visit was conducted virtually. This visit type was conducted due to national recommendations for restrictions regarding the COVID-19 Pandemic (e.g. social distancing, sheltering in place) in an effort to limit this patient's exposure and mitigate transmission in our community. All issues noted in this document were discussed and addressed.  A physical exam was not performed with this format.  I connected with [name] on 09/23/19 at 2:14 pm by telephone and verified that I am speaking with the correct person using two identifiers. Bethany Ford is currently located at home and no one is currently with her during visit. The provider, Evelina Dun, FNP is located in their office at time of visit.  I discussed the limitations, risks, security and privacy concerns of performing an evaluation and management service by telephone and the availability of in person appointments. I also discussed with the patient that there may be a patient responsible charge related to this service. The patient expressed understanding and agreed to proceed.   History and Present Illness:  Dysuria  This is a new problem. The current episode started today. The problem occurs every urination. The problem has been gradually worsening. The quality of the pain is described as burning. The pain is at a severity of 7/10. The pain is mild. Associated symptoms include frequency, hesitancy and urgency. Pertinent negatives include no discharge, flank pain, hematuria, nausea or vomiting. She has tried increased fluids (Uribel ) for the symptoms. The treatment provided mild relief.      Review of Systems  Gastrointestinal: Negative for nausea and vomiting.  Genitourinary: Positive for dysuria, frequency, hesitancy and urgency. Negative for flank pain and hematuria.     Observations/Objective: No SOB or distress noted   Assessment and Plan: 1. UTI  symptoms Force fluids AZO over the counter X2 days RTO if symptoms worsen or do not improve  - cephALEXin (KEFLEX) 500 MG capsule; Take 1 capsule (500 mg total) by mouth 2 (two) times daily.  Dispense: 14 capsule; Refill: 0  I discussed the assessment and treatment plan with the patient. The patient was provided an opportunity to ask questions and all were answered. The patient agreed with the plan and demonstrated an understanding of the instructions.   The patient was advised to call back or seek an in-person evaluation if the symptoms worsen or if the condition fails to improve as anticipated.  The above assessment and management plan was discussed with the patient. The patient verbalized understanding of and has agreed to the management plan. Patient is aware to call the clinic if symptoms persist or worsen. Patient is aware when to return to the clinic for a follow-up visit. Patient educated on when it is appropriate to go to the emergency department.   Time call ended: 2:20 pm    I provided 6 minutes of non-face-to-face time during this encounter.    Evelina Dun, FNP

## 2019-11-20 ENCOUNTER — Other Ambulatory Visit: Payer: Self-pay | Admitting: Family Medicine

## 2019-11-21 ENCOUNTER — Other Ambulatory Visit: Payer: Self-pay

## 2019-11-21 ENCOUNTER — Ambulatory Visit: Payer: BC Managed Care – PPO | Admitting: Family Medicine

## 2019-11-21 ENCOUNTER — Encounter: Payer: Self-pay | Admitting: Family Medicine

## 2019-11-21 VITALS — BP 142/89 | HR 85 | Temp 99.1°F | Resp 20 | Ht 65.0 in | Wt 227.0 lb

## 2019-11-21 DIAGNOSIS — N3001 Acute cystitis with hematuria: Secondary | ICD-10-CM

## 2019-11-21 DIAGNOSIS — R300 Dysuria: Secondary | ICD-10-CM

## 2019-11-21 LAB — URINALYSIS, COMPLETE
Bilirubin, UA: NEGATIVE
Glucose, UA: NEGATIVE
Ketones, UA: NEGATIVE
Nitrite, UA: NEGATIVE
Protein,UA: NEGATIVE
Specific Gravity, UA: 1.01 (ref 1.005–1.030)
Urobilinogen, Ur: 0.2 mg/dL (ref 0.2–1.0)
pH, UA: 6.5 (ref 5.0–7.5)

## 2019-11-21 LAB — MICROSCOPIC EXAMINATION
Renal Epithel, UA: NONE SEEN /hpf
WBC, UA: >30 /hpf — AB (ref 0–5)

## 2019-11-21 MED ORDER — SULFAMETHOXAZOLE-TRIMETHOPRIM 800-160 MG PO TABS: 1.0000 | ORAL_TABLET | Freq: Two times a day (BID) | ORAL | 0 refills | Status: DC

## 2019-11-21 NOTE — Patient Instructions (Signed)

## 2019-11-21 NOTE — Progress Notes (Signed)
FBP:ZWCHENIDP, Bethany Radon, MD Chief Complaint  Patient presents with  . Urinary Tract Infection    pain with urination, frequency x 2 days    Current Issues:  Presents with 2 days of dysuria and urinary frequency. Reports that she began having dysuria and frequency since Saturday. Denies hematuria, reports lower abdominal pain. Denies fevers or chills. Reports UTI about a month ago and states she finished all of her antibiotics then with symptom resolution but feels like the infection may not have completely cleared up.  Associated symptoms include:  dysuria, lower abdominal pain, urinary frequency and urinary urgency  There is a previous history of of similar symptoms. Sexually active:  No   No concern for STI.  Prior to Admission medications   Medication Sig Start Date End Date Taking? Authorizing Provider  amLODipine (NORVASC) 5 MG tablet Take 1 tablet (5 mg total) by mouth daily. 06/06/19  Yes Dettinger, Bethany Radon, MD  B-D 3CC LUER-LOK SYR 25GX1" 25G X 1" 3 ML MISC 12use as directed every week 11/16/17  Yes Mechele Claude, MD  Butalbital-Acetaminophen 50-300 MG TABS 1 BID prn HA 11/16/17  Yes Stacks, Broadus John, MD  cyanocobalamin (,VITAMIN B-12,) 1000 MCG/ML injection inject 1 cc intramuscularly weekly for 30 DAYS 11/11/17  Yes [provider]  dicyclomine (BENTYL) 10 MG capsule TAKE 1 CAPSULE BY MOUTH THREE TIMES DAILY BEFORE MEALS 08/24/19  Yes Dettinger, Bethany Radon, MD  fluticasone (FLONASE) 50 MCG/ACT nasal spray SPRAY 2 SPRAYS INTO EACH NOSTRIL EVERY DAY 09/17/18  Yes Dettinger, Bethany Radon, MD  gabapentin (NEURONTIN) 300 MG capsule Take 900 mg by mouth 4 (four) times daily.    Yes [provider]  lidocaine (LIDODERM) 5 % Place 1 patch onto the skin daily. Remove & Discard patch within 12 hours or as directed by MD   Yes [provider]  lisinopril (ZESTRIL) 30 MG tablet TAKE 1 TABLET BY MOUTH EVERY DAY 08/22/19  Yes Dettinger, Bethany Radon, MD  Meth-Hyo-M Bl-Na Phos-Ph  Sal (URIBEL) 118 MG CAPS Take 1 capsule (118 mg total) by mouth 4 (four) times daily as needed. 12/01/18  Yes Dettinger, Bethany Radon, MD  NYSTATIN powder APPLY TOPICALLY TWICE DAILY 11/21/19  Yes Dettinger, Bethany Radon, MD  pravastatin (PRAVACHOL) 40 MG tablet Take 1 tablet (40 mg total) by mouth daily. 06/06/19  Yes Dettinger, Bethany Radon, MD  tamsulosin (FLOMAX) 0.4 MG CAPS capsule Take 1 capsule (0.4 mg total) by mouth daily as needed. 06/06/19  Yes Dettinger, Bethany Radon, MD  cyclobenzaprine (FLEXERIL) 10 MG tablet Take 10 mg by mouth 3 (three) times daily as needed for muscle spasms.    [provider]  sulfamethoxazole-trimethoprim (BACTRIM DS) 800-160 MG tablet Take 1 tablet by mouth 2 (two) times daily. 11/21/19   Sonny Masters, FNP    Review of Systems  Constitutional: Negative for chills, fever and malaise/fatigue.  Gastrointestinal: Positive for abdominal pain. Negative for nausea and vomiting.  Genitourinary: Positive for dysuria, frequency and urgency. Negative for flank pain and hematuria.  All other systems reviewed and are negative.    PE:  BP (!) 142/89   Pulse 85   Temp 99.1 F (37.3 C)   Resp 20   Ht 5\' 5"  (1.651 m)   Wt 227 lb (103 kg)   SpO2 96%   BMI 37.77 kg/m  Physical Exam Vitals and nursing note reviewed.  Constitutional:      General: She is not in acute distress.    Appearance: Normal  appearance. She is well-developed and well-groomed. She is obese. She is not ill-appearing, toxic-appearing or diaphoretic.  HENT:     Head: Normocephalic and atraumatic.     Jaw: There is normal jaw occlusion.     Right Ear: Hearing normal.     Left Ear: Hearing normal.     Nose: Nose normal.     Mouth/Throat:     Lips: Pink.     Mouth: Mucous membranes are moist.     Pharynx: Oropharynx is clear. Uvula midline.  Eyes:     General: Lids are normal.     Extraocular Movements: Extraocular movements intact.     Conjunctiva/sclera: Conjunctivae normal.     Pupils: Pupils are  equal, round, and reactive to light.  Neck:     Thyroid: No thyroid mass, thyromegaly or thyroid tenderness.     Vascular: No carotid bruit or JVD.     Trachea: Trachea and phonation normal.  Cardiovascular:     Rate and Rhythm: Normal rate and regular rhythm.     Chest Wall: PMI is not displaced.     Pulses: Normal pulses.     Heart sounds: Normal heart sounds. No murmur. No friction rub. No gallop.   Pulmonary:     Effort: Pulmonary effort is normal. No respiratory distress.     Breath sounds: Normal breath sounds. No wheezing.  Abdominal:     General: Bowel sounds are normal. There is no distension or abdominal bruit.     Palpations: Abdomen is soft. There is no hepatomegaly or splenomegaly.     Tenderness: There is no abdominal tenderness. There is no right CVA tenderness or left CVA tenderness.     Hernia: No hernia is present.  Musculoskeletal:        General: Normal range of motion.     Cervical back: Normal range of motion and neck supple.     Right lower leg: No edema.     Left lower leg: No edema.  Lymphadenopathy:     Cervical: No cervical adenopathy.  Skin:    General: Skin is warm and dry.     Capillary Refill: Capillary refill takes less than 2 seconds.     Coloration: Skin is not cyanotic, jaundiced or pale.     Findings: No rash.  Neurological:     General: No focal deficit present.     Mental Status: She is alert and oriented to person, place, and time.     Cranial Nerves: Cranial nerves are intact.     Sensory: Sensation is intact.     Motor: Motor function is intact.     Coordination: Coordination is intact.     Gait: Gait is intact.     Deep Tendon Reflexes: Reflexes are normal and symmetric.  Psychiatric:        Attention and Perception: Attention and perception normal.        Mood and Affect: Mood and affect normal.        Speech: Speech normal.        Behavior: Behavior normal. Behavior is cooperative.        Thought Content: Thought content normal.         Cognition and Memory: Cognition and memory normal.        Judgment: Judgment normal.     Results for orders placed or performed in visit on 08/02/19  Urine Culture   Specimen: Urine   UR  Result Value Ref Range   Urine Culture, Routine  Final report    Organism ID, Bacteria Comment   Microscopic Examination   URINE  Result Value Ref Range   WBC, UA 11-30 (A) 0 - 5 /hpf   RBC 3-10 (A) 0 - 2 /hpf   Epithelial Cells (non renal) 0-10 0 - 10 /hpf   Renal Epithel, UA 0-10 (A) None seen /hpf   Bacteria, UA Few None seen/Few  Urinalysis, Complete  Result Value Ref Range   Specific Gravity, UA 1.010 1.005 - 1.030   pH, UA 6.0 5.0 - 7.5   Color, UA Carranza (A) Yellow   Appearance Ur Hazy (A) Clear   Leukocytes,UA Trace (A) Negative   Protein,UA Negative Negative/Trace   Glucose, UA Negative Negative   Ketones, UA Negative Negative   RBC, UA 2+ (A) Negative   Bilirubin, UA Negative Negative   Urobilinogen, Ur 0.2 0.2 - 1.0 mg/dL   Nitrite, UA Negative Negative   Microscopic Examination See below:     Assessment and Plan:   Bethany Ford was seen today for urinary tract infection.  Diagnoses and all orders for this visit:  Dysuria UA positive for many bacteria, 2+ leukocytes, 1+ blood -     Urine Culture -     Urinalysis, Complete  Acute cystitis with hematuria Will place on treatment therapy below and culture urine. Will change therapy if warranted. Discussed returning in 2 weeks for repeat UA, staying well hydrated and avoiding irritants such as bubble baths.  -     sulfamethoxazole-trimethoprim (BACTRIM DS) 800-160 MG tablet; Take 1 tablet by mouth 2 (two) times daily.   Return in about 2 weeks (around 12/05/2019), or if symptoms worsen or fail to improve, for urine recheck.   The above assessment and management plan was discussed with the patient. The patient verbalized understanding of and has agreed to the management plan. Patient is aware to call the clinic if they  develop any new symptoms or if symptoms fail to improve or worsen. Patient is aware when to return to the clinic for a follow-up visit. Patient educated on when it is appropriate to go to the emergency department.    Kari Baars, FNP-C [facility] [address] [place], [place] [address] [phone]

## 2019-11-24 LAB — URINE CULTURE

## 2019-12-05 ENCOUNTER — Other Ambulatory Visit: Payer: Self-pay | Admitting: Family Medicine

## 2019-12-05 DIAGNOSIS — N3000 Acute cystitis without hematuria: Secondary | ICD-10-CM

## 2019-12-06 ENCOUNTER — Other Ambulatory Visit: Payer: Self-pay

## 2019-12-07 ENCOUNTER — Ambulatory Visit: Payer: BC Managed Care – PPO | Admitting: Family Medicine

## 2019-12-07 ENCOUNTER — Encounter: Payer: Self-pay | Admitting: Family Medicine

## 2019-12-07 VITALS — BP 144/87 | HR 74 | Temp 98.4°F | Ht 65.0 in | Wt 228.2 lb

## 2019-12-07 DIAGNOSIS — R5383 Other fatigue: Secondary | ICD-10-CM

## 2019-12-07 DIAGNOSIS — K582 Mixed irritable bowel syndrome: Secondary | ICD-10-CM | POA: Diagnosis not present

## 2019-12-07 DIAGNOSIS — Z87440 Personal history of urinary (tract) infections: Secondary | ICD-10-CM

## 2019-12-07 DIAGNOSIS — N200 Calculus of kidney: Secondary | ICD-10-CM

## 2019-12-07 DIAGNOSIS — E782 Mixed hyperlipidemia: Secondary | ICD-10-CM

## 2019-12-07 DIAGNOSIS — I10 Essential (primary) hypertension: Secondary | ICD-10-CM

## 2019-12-07 DIAGNOSIS — Z1211 Encounter for screening for malignant neoplasm of colon: Secondary | ICD-10-CM

## 2019-12-07 LAB — URINALYSIS, COMPLETE
Bilirubin, UA: NEGATIVE
Glucose, UA: NEGATIVE
Ketones, UA: NEGATIVE
Nitrite, UA: POSITIVE — AB
Protein,UA: NEGATIVE
Specific Gravity, UA: 1.015 (ref 1.005–1.030)
Urobilinogen, Ur: 0.2 mg/dL (ref 0.2–1.0)
pH, UA: 6 (ref 5.0–7.5)

## 2019-12-07 LAB — MICROSCOPIC EXAMINATION: Renal Epithel, UA: NONE SEEN /hpf

## 2019-12-07 MED ORDER — DICYCLOMINE HCL 10 MG PO CAPS: 10.0000 mg | ORAL_CAPSULE | Freq: Three times a day (TID) | ORAL | 3 refills | Status: DC

## 2019-12-07 MED ORDER — LISINOPRIL 30 MG PO TABS: 30.0000 mg | ORAL_TABLET | Freq: Every day | ORAL | 3 refills | Status: DC

## 2019-12-07 NOTE — Progress Notes (Signed)
BP (!) 144/87   Pulse 74   Temp 98.4 F (36.9 C) (Temporal)   Ht '5\' 5"'$  (1.651 m)   Wt 228 lb 3.2 oz (103.5 kg)   SpO2 96%   BMI 37.97 kg/m    Subjective:   Patient ID: Bethany Ford, female    DOB: 11/03/1960, 59 y.o.   MRN: [id number]  HPI: Bethany Ford is a 59 y.o. female presenting on 12/07/2019 for Hypertension (6 month follow up)   HPI Patient has recurrent renal stones and she says she just passed one yesterday, she also was treated for UTI couple weeks ago and she wants to redo her urinalysis.  She denies any symptoms currently with urine.  Hypertension Patient is currently on amlodipine, and their blood pressure today is 144/87, she will keep track of it over the next 2 weeks and let us know how it is running, she just passed a renal stone yesterday. Patient denies any lightheadedness or dizziness. Patient denies headaches, blurred vision, chest pains, shortness of breath, or weakness. Denies any side effects from medication and is content with current medication.   Hyperlipidemia Patient is coming in for recheck of his hyperlipidemia. The patient is currently taking pravastatin. They deny any issues with myalgias or history of liver damage from it. They deny any focal numbness or weakness or chest pain.   Patient is complaining of energy and fatigue been down she says she did just do a sleep study last night but has not got the results back yet.  She says she feels like she is sleeping 8 hours at night but then she just has no energy during the day.  Relevant past medical, surgical, family and social history reviewed and updated as indicated. Interim medical history since our last visit reviewed. Allergies and medications reviewed and updated.  Review of Systems  Constitutional: Positive for fatigue. Negative for chills and fever.  HENT: Negative for congestion, ear discharge and ear pain.   Eyes: Negative for redness and visual disturbance.  Respiratory: Negative for  chest tightness and shortness of breath.   Cardiovascular: Negative for chest pain and leg swelling.  Gastrointestinal: Negative for abdominal pain, constipation, diarrhea and vomiting.  Genitourinary: Negative for difficulty urinating and dysuria.  Musculoskeletal: Negative for back pain and gait problem.  Skin: Negative for rash.  Neurological: Negative for weakness, light-headedness, numbness and headaches.  Psychiatric/Behavioral: Positive for sleep disturbance. Negative for agitation and behavioral problems.  All other systems reviewed and are negative.   Per HPI unless specifically indicated above   Allergies as of 12/07/2019      Reactions   Latex Hives   severe   Vitamin E Other (See Comments)   Severe neuropathy   Codeine Nausea And Vomiting   moderate      Medication List       Accurate as of December 07, 2019  8:43 AM. If you have any questions, ask your nurse or doctor.        STOP taking these medications   sulfamethoxazole-trimethoprim 800-160 MG tablet Commonly known as: BACTRIM DS Stopped by: Fransisca Kaufmann Shanera Meske, MD     TAKE these medications   amLODipine 5 MG tablet Commonly known as: NORVASC Take 1 tablet (5 mg total) by mouth daily.   B-D 3CC LUER-LOK SYR 25GX1" 25G X 1" 3 ML Misc Generic drug: SYRINGE-NEEDLE (DISP) 3 ML 12use as directed every week   Butalbital-Acetaminophen 50-300 MG Tabs 1 BID prn HA   cyanocobalamin 1000 MCG/ML  injection Commonly known as: (VITAMIN B-12) inject 1 cc intramuscularly weekly for 30 DAYS   cyclobenzaprine 10 MG tablet Commonly known as: FLEXERIL Take 10 mg by mouth 3 (three) times daily as needed for muscle spasms.   dicyclomine 10 MG capsule Commonly known as: BENTYL Take 1 capsule (10 mg total) by mouth 3 (three) times daily before meals. What changed: See the new instructions. Changed by: Fransisca Kaufmann Sheree Lalla, MD   fluticasone 50 MCG/ACT nasal spray Commonly known as: FLONASE SPRAY 2 SPRAYS INTO EACH  NOSTRIL EVERY DAY   gabapentin 300 MG capsule Commonly known as: NEURONTIN Take 900 mg by mouth 4 (four) times daily.   lidocaine 5 % Commonly known as: LIDODERM Place 1 patch onto the skin daily. Remove & Discard patch within 12 hours or as directed by MD   lisinopril 30 MG tablet Commonly known as: ZESTRIL Take 1 tablet (30 mg total) by mouth daily.   nystatin powder Generic drug: nystatin APPLY TOPICALLY TWICE DAILY   oxycodone 30 MG immediate release tablet Commonly known as: ROXICODONE Take 30 mg by mouth every 6 (six) hours as needed.   pravastatin 40 MG tablet Commonly known as: PRAVACHOL Take 1 tablet (40 mg total) by mouth daily.   tamsulosin 0.4 MG Caps capsule Commonly known as: FLOMAX Take 1 capsule (0.4 mg total) by mouth daily as needed.   Uribel 118 MG Caps TAKE ONE CAPSULE BY MOUTH FOUR TIMES DAILY AS NEEDED        Objective:   BP (!) 144/87   Pulse 74   Temp 98.4 F (36.9 C) (Temporal)   Ht '5\' 5"'$  (1.651 m)   Wt 228 lb 3.2 oz (103.5 kg)   SpO2 96%   BMI 37.97 kg/m   Wt Readings from Last 3 Encounters:  12/07/19 228 lb 3.2 oz (103.5 kg)  11/21/19 227 lb (103 kg)  08/02/19 225 lb (102.1 kg)    Physical Exam Vitals and nursing note reviewed.  Constitutional:      General: She is not in acute distress.    Appearance: She is well-developed. She is not diaphoretic.  Eyes:     Conjunctiva/sclera: Conjunctivae normal.  Cardiovascular:     Rate and Rhythm: Normal rate and regular rhythm.     Heart sounds: Normal heart sounds. No murmur.  Pulmonary:     Effort: Pulmonary effort is normal. No respiratory distress.     Breath sounds: Normal breath sounds. No wheezing.  Abdominal:     General: Abdomen is flat. Bowel sounds are normal. There is no distension.     Tenderness: There is no abdominal tenderness. There is no right CVA tenderness or left CVA tenderness.  Musculoskeletal:        General: No tenderness. Normal range of motion.  Skin:     General: Skin is warm and dry.     Findings: No rash.  Neurological:     Mental Status: She is alert and oriented to person, place, and time.     Coordination: Coordination normal.  Psychiatric:        Behavior: Behavior normal.       Assessment & Plan:   Problem List Items Addressed This Visit      Cardiovascular and Mediastinum   Hypertension   Relevant Medications   lisinopril (ZESTRIL) 30 MG tablet   Other Relevant Orders   CBC with Differential/Platelet   CMP14+EGFR     Genitourinary   Renal stones   Relevant Medications  oxycodone (ROXICODONE) 30 MG immediate release tablet   Other Relevant Orders   Urinalysis, Complete   Urine Culture     Other   Hyperlipidemia - Primary   Relevant Medications   lisinopril (ZESTRIL) 30 MG tablet   Other Relevant Orders   Lipid panel    Other Visit Diagnoses    Irritable bowel syndrome with both constipation and diarrhea       Relevant Medications   dicyclomine (BENTYL) 10 MG capsule   Other Relevant Orders   CBC with Differential/Platelet   Colon cancer screening       Relevant Orders   Cologuard   History of recurrent UTI (urinary tract infection)       Relevant Orders   Urinalysis, Complete   Urine Culture   Fatigue, unspecified type       Relevant Orders   TSH      Fatigue is possibly due to some sleep issue such as sleep apnea, she just had a sleep study yesterday but will check a TSH.  Also check a CBC.  Blood pressure is elevated slightly today but she just passed a renal stone yesterday, will monitor for now and have her call in for 2 weeks with some blood pressure results. Follow up plan: Return in about 6 months (around 06/05/2020), or if symptoms worsen or fail to improve, for Hypertension recheck.  Counseling provided for all of the vaccine components Orders Placed This Encounter  Procedures  . Urine Culture  . Cologuard  . CBC with Differential/Platelet  . CMP14+EGFR  . Lipid panel  .  Urinalysis, Complete  . TSH    Caryl Pina, MD [facility] 12/07/2019, 8:43 AM

## 2019-12-08 ENCOUNTER — Encounter: Payer: Self-pay | Admitting: Family Medicine

## 2019-12-08 LAB — CBC WITH DIFFERENTIAL/PLATELET
Basophils Absolute: 0 10*3/uL (ref 0.0–0.2)
Basos: 1 %
EOS (ABSOLUTE): 0.3 10*3/uL (ref 0.0–0.4)
Eos: 4 %
Hematocrit: 39 % (ref 34.0–46.6)
Hemoglobin: 12.9 g/dL (ref 11.1–15.9)
Immature Grans (Abs): 0 10*3/uL (ref 0.0–0.1)
Immature Granulocytes: 0 %
Lymphocytes Absolute: 3.1 10*3/uL (ref 0.7–3.1)
Lymphs: 37 %
MCH: 30.5 pg (ref 26.6–33.0)
MCHC: 33.1 g/dL (ref 31.5–35.7)
MCV: 92 fL (ref 79–97)
Monocytes Absolute: 0.7 10*3/uL (ref 0.1–0.9)
Monocytes: 9 %
Neutrophils Absolute: 4.3 10*3/uL (ref 1.4–7.0)
Neutrophils: 49 %
Platelets: 307 10*3/uL (ref 150–450)
RBC: 4.23 x10E6/uL (ref 3.77–5.28)
RDW: 12.5 % (ref 11.7–15.4)
WBC: 8.5 10*3/uL (ref 3.4–10.8)

## 2019-12-08 LAB — CMP14+EGFR
ALT: 21 IU/L (ref 0–32)
AST: 23 IU/L (ref 0–40)
Albumin/Globulin Ratio: 1.6 (ref 1.2–2.2)
Albumin: 4.4 g/dL (ref 3.8–4.9)
Alkaline Phosphatase: 107 IU/L (ref 39–117)
BUN/Creatinine Ratio: 15 (ref 9–23)
BUN: 15 mg/dL (ref 6–24)
Bilirubin Total: 0.3 mg/dL (ref 0.0–1.2)
CO2: 24 mmol/L (ref 20–29)
Calcium: 9.4 mg/dL (ref 8.7–10.2)
Chloride: 100 mmol/L (ref 96–106)
Creatinine, Ser: 0.98 mg/dL (ref 0.57–1.00)
GFR calc Af Amer: 75 mL/min/{1.73_m2} (ref >59)
GFR calc non Af Amer: 65 mL/min/{1.73_m2} (ref >59)
Globulin, Total: 2.7 g/dL (ref 1.5–4.5)
Glucose: 93 mg/dL (ref 65–99)
Potassium: 4.2 mmol/L (ref 3.5–5.2)
Sodium: 140 mmol/L (ref 134–144)
Total Protein: 7.1 g/dL (ref 6.0–8.5)

## 2019-12-08 LAB — LIPID PANEL
Chol/HDL Ratio: 2.6 ratio (ref 0.0–4.4)
Cholesterol, Total: 154 mg/dL (ref 100–199)
HDL: 60 mg/dL (ref >39)
LDL Chol Calc (NIH): 79 mg/dL (ref 0–99)
Triglycerides: 79 mg/dL (ref 0–149)
VLDL Cholesterol Cal: 15 mg/dL (ref 5–40)

## 2019-12-08 LAB — TSH: TSH: 2.24 u[IU]/mL (ref 0.450–4.500)

## 2019-12-09 LAB — URINE CULTURE

## 2019-12-09 MED ORDER — CEPHALEXIN 500 MG PO CAPS: 500.0000 mg | ORAL_CAPSULE | Freq: Four times a day (QID) | ORAL | 0 refills | Status: DC

## 2019-12-09 NOTE — Addendum Note (Signed)
Addended by: Arville Care on: 12/09/2019 02:08 PM   Modules accepted: Orders

## 2020-01-04 LAB — EXTERNAL GENERIC LAB PROCEDURE: COLOGUARD: NEGATIVE

## 2020-01-04 LAB — COLOGUARD: COLOGUARD: NEGATIVE

## 2020-01-11 LAB — COLOGUARD: Cologuard: NEGATIVE

## 2020-01-24 ENCOUNTER — Other Ambulatory Visit: Payer: Self-pay | Admitting: Family Medicine

## 2020-02-20 ENCOUNTER — Other Ambulatory Visit: Payer: Self-pay | Admitting: Family Medicine

## 2020-06-01 ENCOUNTER — Other Ambulatory Visit: Payer: Self-pay | Admitting: Family Medicine

## 2020-06-01 DIAGNOSIS — I10 Essential (primary) hypertension: Secondary | ICD-10-CM

## 2020-06-01 DIAGNOSIS — E782 Mixed hyperlipidemia: Secondary | ICD-10-CM

## 2020-06-01 DIAGNOSIS — N200 Calculus of kidney: Secondary | ICD-10-CM

## 2020-06-05 ENCOUNTER — Other Ambulatory Visit: Payer: Self-pay | Admitting: Family Medicine

## 2020-06-06 ENCOUNTER — Other Ambulatory Visit: Payer: Self-pay

## 2020-06-06 ENCOUNTER — Ambulatory Visit (INDEPENDENT_AMBULATORY_CARE_PROVIDER_SITE_OTHER): Payer: BC Managed Care – PPO | Admitting: Family Medicine

## 2020-06-06 ENCOUNTER — Encounter: Payer: Self-pay | Admitting: Family Medicine

## 2020-06-06 VITALS — BP 125/86 | HR 74 | Temp 97.9°F | Ht 65.0 in | Wt 230.0 lb

## 2020-06-06 DIAGNOSIS — Z23 Encounter for immunization: Secondary | ICD-10-CM | POA: Diagnosis not present

## 2020-06-06 DIAGNOSIS — E782 Mixed hyperlipidemia: Secondary | ICD-10-CM | POA: Diagnosis not present

## 2020-06-06 DIAGNOSIS — Z1231 Encounter for screening mammogram for malignant neoplasm of breast: Secondary | ICD-10-CM

## 2020-06-06 DIAGNOSIS — I10 Essential (primary) hypertension: Secondary | ICD-10-CM | POA: Diagnosis not present

## 2020-06-06 NOTE — Progress Notes (Signed)
BP 125/86   Pulse 74   Temp 97.9 F (36.6 C)   Ht $R'5\' 5"'hH$  (1.651 m)   Wt 230 lb (104.3 kg)   SpO2 95%   BMI 38.27 kg/m    Subjective:   Patient ID: Bethany Ford, female    DOB: [birth year], 59 y.o.   MRN: [id number]  HPI: Bethany Ford is a 59 y.o. female presenting on 06/06/2020 for Medical Management of Chronic Issues, Hyperlipidemia, and Hypertension   HPI Hyperlipidemia Patient is coming in for recheck of his hyperlipidemia. The patient is currently taking pravastatin. They deny any issues with myalgias or history of liver damage from it. They deny any focal numbness or weakness or chest pain.   Hypertension Patient is currently on amlodipine and lisinopril, and their blood pressure today is 125/86. Patient denies any lightheadedness or dizziness. Patient denies headaches, blurred vision, chest pains, shortness of breath, or weakness. Denies any side effects from medication and is content with current medication.   Patient does have neuropathy as well takes gabapentin to help with this, she also sees a pain management doctor.  Patient takes Lynnda Shields because of recurrent bladder infections and recurrent renal stones, she did see her urologist as well.  She seems to be doing well right now but we did talk about possible prevention in the future.  Relevant past medical, surgical, family and social history reviewed and updated as indicated. Interim medical history since our last visit reviewed. Allergies and medications reviewed and updated.  Review of Systems  Constitutional: Negative for chills and fever.  Eyes: Negative for visual disturbance.  Respiratory: Negative for chest tightness and shortness of breath.   Cardiovascular: Negative for chest pain and leg swelling.  Gastrointestinal: Negative for abdominal pain.  Genitourinary: Negative for difficulty urinating, dysuria, frequency and urgency.  Musculoskeletal: Negative for back pain and gait problem.  Skin: Negative for  rash.  Neurological: Positive for numbness. Negative for weakness, light-headedness and headaches.  Psychiatric/Behavioral: Negative for agitation and behavioral problems.  All other systems reviewed and are negative.   Per HPI unless specifically indicated above   Allergies as of 06/06/2020      Reactions   Latex Hives   severe   Vitamin E Other (See Comments)   Severe neuropathy   Codeine Nausea And Vomiting   moderate      Medication List       Accurate as of June 06, 2020  3:17 PM. If you have any questions, ask your nurse or doctor.        STOP taking these medications   cephALEXin 500 MG capsule Commonly known as: KEFLEX Stopped by: Fransisca Kaufmann Shamere Campas, MD     TAKE these medications   amLODipine 5 MG tablet Commonly known as: NORVASC TAKE 1 TABLET(5 MG) BY MOUTH DAILY   B-D 3CC LUER-LOK SYR 25GX1" 25G X 1" 3 ML Misc Generic drug: SYRINGE-NEEDLE (DISP) 3 ML 12use as directed every week   Butalbital-Acetaminophen 50-300 MG Tabs 1 BID prn HA   cyanocobalamin 1000 MCG/ML injection Commonly known as: (VITAMIN B-12) inject 1 cc intramuscularly weekly for 30 DAYS   cyclobenzaprine 10 MG tablet Commonly known as: FLEXERIL Take 10 mg by mouth 3 (three) times daily as needed for muscle spasms.   dicyclomine 10 MG capsule Commonly known as: BENTYL Take 1 capsule (10 mg total) by mouth 3 (three) times daily before meals.   fluticasone 50 MCG/ACT nasal spray Commonly known as: FLONASE INSTILL 2 SPRAYS BY NASAL ROUTE  DAILY   gabapentin 300 MG capsule Commonly known as: NEURONTIN Take 900 mg by mouth 4 (four) times daily.   lidocaine 5 % Commonly known as: LIDODERM Place 1 patch onto the skin daily. Remove & Discard patch within 12 hours or as directed by MD   lisinopril 30 MG tablet Commonly known as: ZESTRIL Take 1 tablet (30 mg total) by mouth daily.   nystatin powder Generic drug: nystatin APPLY TOPICALLY TWICE DAILY   oxycodone 30 MG immediate  release tablet Commonly known as: ROXICODONE Take 30 mg by mouth every 6 (six) hours as needed.   pravastatin 40 MG tablet Commonly known as: PRAVACHOL TAKE 1 TABLET(40 MG) BY MOUTH DAILY   tamsulosin 0.4 MG Caps capsule Commonly known as: FLOMAX TAKE 1 CAPSULE(0.4 MG) BY MOUTH DAILY AS NEEDED   Uribel 118 MG Caps TAKE ONE CAPSULE BY MOUTH FOUR TIMES DAILY AS NEEDED        Objective:   BP 125/86   Pulse 74   Temp 97.9 F (36.6 C)   Ht $R'5\' 5"'zq$  (1.651 m)   Wt 230 lb (104.3 kg)   SpO2 95%   BMI 38.27 kg/m   Wt Readings from Last 3 Encounters:  06/06/20 230 lb (104.3 kg)  12/07/19 228 lb 3.2 oz (103.5 kg)  11/21/19 227 lb (103 kg)    Physical Exam Vitals and nursing note reviewed.  Constitutional:      General: She is not in acute distress.    Appearance: She is well-developed. She is not diaphoretic.  Eyes:     Conjunctiva/sclera: Conjunctivae normal.     Pupils: Pupils are equal, round, and reactive to light.  Cardiovascular:     Rate and Rhythm: Normal rate and regular rhythm.     Heart sounds: Normal heart sounds. No murmur heard.   Pulmonary:     Effort: Pulmonary effort is normal. No respiratory distress.     Breath sounds: Normal breath sounds. No wheezing.  Musculoskeletal:        General: No tenderness. Normal range of motion.  Skin:    General: Skin is warm and dry.     Findings: No rash.  Neurological:     Mental Status: She is alert and oriented to person, place, and time.     Coordination: Coordination normal.  Psychiatric:        Behavior: Behavior normal.       Assessment & Plan:   Problem List Items Addressed This Visit      Cardiovascular and Mediastinum   Hypertension - Primary   Relevant Orders   CBC with Differential/Platelet     Other   Hyperlipidemia   Relevant Orders   CMP14+EGFR   Lipid panel    Other Visit Diagnoses    Breast cancer screening by mammogram       Relevant Orders   MM 3D SCREEN BREAST BILATERAL       Continue current medication. Follow up plan: Return in about 6 months (around 12/07/2020), or if symptoms worsen or fail to improve, for Hypertension and hyperlipidemia.  Counseling provided for all of the vaccine components Orders Placed This Encounter  Procedures  . MM 3D SCREEN BREAST BILATERAL  . Tdap vaccine greater than or equal to 7yo IM  . CBC with Differential/Platelet  . CMP14+EGFR  . Lipid panel    Caryl Pina, MD [facility] 06/06/2020, 3:17 PM

## 2020-06-07 LAB — CBC WITH DIFFERENTIAL/PLATELET
Basophils Absolute: 0 10*3/uL (ref 0.0–0.2)
Basos: 0 %
EOS (ABSOLUTE): 0.4 10*3/uL (ref 0.0–0.4)
Eos: 5 %
Hematocrit: 38.1 % (ref 34.0–46.6)
Hemoglobin: 13.1 g/dL (ref 11.1–15.9)
Immature Grans (Abs): 0 10*3/uL (ref 0.0–0.1)
Immature Granulocytes: 0 %
Lymphocytes Absolute: 2.8 10*3/uL (ref 0.7–3.1)
Lymphs: 33 %
MCH: 31 pg (ref 26.6–33.0)
MCHC: 34.4 g/dL (ref 31.5–35.7)
MCV: 90 fL (ref 79–97)
Monocytes Absolute: 0.8 10*3/uL (ref 0.1–0.9)
Monocytes: 10 %
Neutrophils Absolute: 4.5 10*3/uL (ref 1.4–7.0)
Neutrophils: 52 %
Platelets: 325 10*3/uL (ref 150–450)
RBC: 4.22 x10E6/uL (ref 3.77–5.28)
RDW: 12.5 % (ref 11.7–15.4)
WBC: 8.6 10*3/uL (ref 3.4–10.8)

## 2020-06-07 LAB — CMP14+EGFR
ALT: 17 IU/L (ref 0–32)
AST: 19 IU/L (ref 0–40)
Albumin/Globulin Ratio: 1.5 (ref 1.2–2.2)
Albumin: 4.6 g/dL (ref 3.8–4.9)
Alkaline Phosphatase: 120 IU/L (ref 48–121)
BUN/Creatinine Ratio: 16 (ref 9–23)
BUN: 19 mg/dL (ref 6–24)
Bilirubin Total: 0.2 mg/dL (ref 0.0–1.2)
CO2: 24 mmol/L (ref 20–29)
Calcium: 9.5 mg/dL (ref 8.7–10.2)
Chloride: 102 mmol/L (ref 96–106)
Creatinine, Ser: 1.17 mg/dL — ABNORMAL HIGH (ref 0.57–1.00)
GFR calc Af Amer: 60 mL/min/{1.73_m2} (ref >59)
GFR calc non Af Amer: 52 mL/min/{1.73_m2} — ABNORMAL LOW (ref >59)
Globulin, Total: 3.1 g/dL (ref 1.5–4.5)
Glucose: 89 mg/dL (ref 65–99)
Potassium: 4.1 mmol/L (ref 3.5–5.2)
Sodium: 141 mmol/L (ref 134–144)
Total Protein: 7.7 g/dL (ref 6.0–8.5)

## 2020-06-07 LAB — LIPID PANEL
Chol/HDL Ratio: 2.8 ratio (ref 0.0–4.4)
Cholesterol, Total: 176 mg/dL (ref 100–199)
HDL: 63 mg/dL (ref >39)
LDL Chol Calc (NIH): 98 mg/dL (ref 0–99)
Triglycerides: 84 mg/dL (ref 0–149)
VLDL Cholesterol Cal: 15 mg/dL (ref 5–40)

## 2020-07-21 ENCOUNTER — Other Ambulatory Visit: Payer: Self-pay | Admitting: Family Medicine

## 2020-08-01 ENCOUNTER — Other Ambulatory Visit: Payer: Self-pay | Admitting: Family Medicine

## 2020-08-01 DIAGNOSIS — I10 Essential (primary) hypertension: Secondary | ICD-10-CM

## 2020-08-13 ENCOUNTER — Ambulatory Visit (INDEPENDENT_AMBULATORY_CARE_PROVIDER_SITE_OTHER): Payer: BC Managed Care – PPO | Admitting: Family

## 2020-08-13 ENCOUNTER — Encounter: Payer: Self-pay | Admitting: Family

## 2020-08-13 DIAGNOSIS — R399 Unspecified symptoms and signs involving the genitourinary system: Secondary | ICD-10-CM | POA: Diagnosis not present

## 2020-08-13 LAB — URINALYSIS, COMPLETE
Bilirubin, UA: NEGATIVE
Glucose, UA: NEGATIVE
Ketones, UA: NEGATIVE
Nitrite, UA: NEGATIVE
Protein,UA: NEGATIVE
Specific Gravity, UA: 1.01 (ref 1.005–1.030)
Urobilinogen, Ur: 0.2 mg/dL (ref 0.2–1.0)
pH, UA: 6 (ref 5.0–7.5)

## 2020-08-13 LAB — MICROSCOPIC EXAMINATION: Epithelial Cells (non renal): NONE SEEN /hpf (ref 0–10)

## 2020-08-13 MED ORDER — CEPHALEXIN 500 MG PO CAPS: 500.0000 mg | ORAL_CAPSULE | Freq: Two times a day (BID) | ORAL | 0 refills | Status: DC

## 2020-08-13 NOTE — Progress Notes (Signed)
   Virtual Visit via telephone Note Due to COVID-19 pandemic this visit was conducted virtually. This visit type was conducted due to national recommendations for restrictions regarding the COVID-19 Pandemic (e.g. social distancing, sheltering in place) in an effort to limit this patient's exposure and mitigate transmission in our community. All issues noted in this document were discussed and addressed.  A physical exam was not performed with this format.  I connected with Bethany Ford on 08/13/20 at 10:27 AM  by telephone and verified that I am speaking with the correct person using two identifiers. Bethany Ford is currently located at work and no one is currently with her during visit. The provider, Jannifer Rodney, FNP is located in their office at time of visit.  I discussed the limitations, risks, security and privacy concerns of performing an evaluation and management service by telephone and the availability of in person appointments. I also discussed with the patient that there may be a patient responsible charge related to this service. The patient expressed understanding and agreed to proceed.   History and Present Illness:  Dysuria  This is a new problem. The current episode started in the past 7 days (Sunday AM). The problem occurs every urination. The problem has been waxing and waning. The quality of the pain is described as burning. The pain is at a severity of 0/10 (with her medications). The patient is experiencing no pain. Associated symptoms include frequency, hesitancy and urgency. Pertinent negatives include no chills, discharge, flank pain, hematuria, nausea or vomiting. Associated symptoms comments: Foul odor, cloudy. She has tried increased fluids (Uribel ) for the symptoms. The treatment provided mild relief.      Review of Systems  Constitutional: Negative for chills.  Gastrointestinal: Negative for nausea and vomiting.  Genitourinary: Positive for dysuria, frequency,  hesitancy and urgency. Negative for flank pain and hematuria.  All other systems reviewed and are negative.    Observations/Objective: No SOB or distress noted   Assessment and Plan: Bethany Ford comes in today with chief complaint of No chief complaint on file.   Diagnosis and orders addressed:  1. UTI symptoms Force fluids Continue Uribel RTO if symptoms worsen or do not improve  Culture pending - cephALEXin (KEFLEX) 500 MG capsule; Take 1 capsule (500 mg total) by mouth 2 (two) times daily.  Dispense: 20 capsule; Refill: 0 - Urinalysis, Complete; Future - Urine Culture; Future      I discussed the assessment and treatment plan with the patient. The patient was provided an opportunity to ask questions and all were answered. The patient agreed with the plan and demonstrated an understanding of the instructions.   The patient was advised to call back or seek an in-person evaluation if the symptoms worsen or if the condition fails to improve as anticipated.  The above assessment and management plan was discussed with the patient. The patient verbalized understanding of and has agreed to the management plan. Patient is aware to call the clinic if symptoms persist or worsen. Patient is aware when to return to the clinic for a follow-up visit. Patient educated on when it is appropriate to go to the emergency department.   Time call ended:  10:38 AM  I provided 11 minutes of non-face-to-face time during this encounter.    Jannifer Rodney, FNP '

## 2020-08-13 NOTE — Addendum Note (Signed)
Addended by: Margorie John on: 08/13/2020 04:35 PM   Modules accepted: Orders

## 2020-08-16 LAB — URINE CULTURE

## 2020-09-01 ENCOUNTER — Other Ambulatory Visit: Payer: Self-pay | Admitting: Family Medicine

## 2020-09-01 DIAGNOSIS — E782 Mixed hyperlipidemia: Secondary | ICD-10-CM

## 2020-09-01 DIAGNOSIS — I10 Essential (primary) hypertension: Secondary | ICD-10-CM

## 2020-09-01 DIAGNOSIS — N200 Calculus of kidney: Secondary | ICD-10-CM

## 2020-09-10 ENCOUNTER — Other Ambulatory Visit: Payer: Self-pay

## 2020-09-10 ENCOUNTER — Other Ambulatory Visit: Payer: Self-pay | Admitting: Family Medicine

## 2020-09-10 ENCOUNTER — Encounter: Payer: Self-pay | Admitting: Nurse Practitioner

## 2020-09-10 ENCOUNTER — Ambulatory Visit
Admission: RE | Admit: 2020-09-10 | Discharge: 2020-09-10 | Disposition: A | Discharge status: Home / Self Care | Payer: BC Managed Care – PPO | Source: Ambulatory Visit | Attending: Family Medicine | Admitting: Family Medicine

## 2020-09-10 ENCOUNTER — Ambulatory Visit (INDEPENDENT_AMBULATORY_CARE_PROVIDER_SITE_OTHER): Payer: BC Managed Care – PPO | Admitting: Nurse Practitioner

## 2020-09-10 DIAGNOSIS — R300 Dysuria: Secondary | ICD-10-CM | POA: Diagnosis not present

## 2020-09-10 DIAGNOSIS — K582 Mixed irritable bowel syndrome: Secondary | ICD-10-CM

## 2020-09-10 DIAGNOSIS — N3000 Acute cystitis without hematuria: Secondary | ICD-10-CM | POA: Diagnosis not present

## 2020-09-10 DIAGNOSIS — Z1231 Encounter for screening mammogram for malignant neoplasm of breast: Secondary | ICD-10-CM

## 2020-09-10 LAB — URINALYSIS, MICROSCOPIC ONLY

## 2020-09-10 MED ORDER — CIPROFLOXACIN HCL 250 MG PO TABS: 250.0000 mg | ORAL_TABLET | Freq: Two times a day (BID) | ORAL | 0 refills | Status: AC

## 2020-09-10 MED ORDER — URIBEL 118 MG PO CAPS: ORAL_CAPSULE | ORAL | 3 refills | Status: AC

## 2020-09-10 NOTE — Progress Notes (Signed)
° °  Virtual Visit via telephone Note Due to COVID-19 pandemic this visit was conducted virtually. This visit type was conducted due to national recommendations for restrictions regarding the COVID-19 Pandemic (e.g. social distancing, sheltering in place) in an effort to limit this patient's exposure and mitigate transmission in our community. All issues noted in this document were discussed and addressed.  A physical exam was not performed with this format.  I connected with Bethany Ford on 09/10/20 at 09:10am by telephone and verified that I am speaking with the correct person using two identifiers. Bethany Ford is currently located at home and no one  is currently with patient during visit. The provider, Daryll Drown, NP is located in their office at time of visit.  I discussed the limitations, risks, security and privacy concerns of performing an evaluation and management service by telephone and the availability of in person appointments. I also discussed with the patient that there may be a patient responsible charge related to this service. The patient expressed understanding and agreed to proceed.   History and Present Illness:  Dysuria  This is a recurrent problem. The current episode started in the past 7 days. The problem occurs every urination. The problem has been gradually worsening. The quality of the pain is described as burning and aching. There has been no fever. Associated symptoms include frequency. Pertinent negatives include no chills, flank pain, possible pregnancy or vomiting. She has tried antibiotics for the symptoms. The treatment provided mild relief.      Review of Systems  Constitutional: Negative for chills.  Gastrointestinal: Negative for vomiting.  Genitourinary: Positive for dysuria and frequency. Negative for flank pain.  All other systems reviewed and are negative.    Observations/Objective: Tele- visit  Assessment and Plan:  Dysuria Uncontrolled  dysuria.  Symptoms not well controlled.  Patient had UTI 08/13/2020, was treated with antibiotics Keflex 500 mg for 10 days.  Patient reports having relief and after antibiotic treatment symptoms returned.  Patient is reporting burning, frequency, cloudy foul-smelling urine with pelvic pain.  Advised patient to continue to increase hydration, come to clinic to leave urine samples for urinalysis and culture.,  Started patient on Cipro 250 mg twice daily for 3 days.  Rx sent to pharmacy. Follow-up with worsening or unresolved symptoms.    Follow Up Instructions   I discussed the assessment and treatment plan with the patient. The patient was provided an opportunity to ask questions and all were answered. The patient agreed with the plan and demonstrated an understanding of the instructions.   The patient was advised to call back or seek an in-person evaluation if the symptoms worsen or if the condition fails to improve as anticipated.  The above assessment and management plan was discussed with the patient. The patient verbalized understanding of and has agreed to the management plan. Patient is aware to call the clinic if symptoms persist or worsen. Patient is aware when to return to the clinic for a follow-up visit. Patient educated on when it is appropriate to go to the emergency department.   Time call ended:  09:22 am I provided 12 minutes of non-face-to-face time during this encounter.    Daryll Drown, NP

## 2020-09-10 NOTE — Assessment & Plan Note (Addendum)
Uncontrolled dysuria.  Symptoms not well controlled.  Patient had UTI 08/13/2020, was treated with antibiotics Keflex 500 mg for 10 days.  Patient reports having relief and after antibiotic treatment symptoms returned.  Patient is reporting burning, frequency, cloudy foul-smelling urine with pelvic pain.  Advised patient to continue to increase hydration, come to clinic to leave urine samples for urinalysis and culture.,  Started patient on Cipro 250 mg twice daily for 3 days.  Rx sent to pharmacy. Follow-up with worsening or unresolved symptoms.

## 2020-09-10 NOTE — Patient Instructions (Signed)

## 2020-09-12 ENCOUNTER — Encounter: Payer: Self-pay | Admitting: Family Medicine

## 2020-09-12 ENCOUNTER — Telehealth: Payer: Self-pay

## 2020-09-12 LAB — URINE CULTURE: Organism ID, Bacteria: NO GROWTH

## 2020-09-12 NOTE — Telephone Encounter (Signed)
Patient had an appointment with JE on 11/29- please advise

## 2020-09-12 NOTE — Telephone Encounter (Signed)
Pt came in to let us know that she just took her last of her Cipro antibiotic for UTI but says she is still having all of the UTI symptoms and would like a refill on the antibiotic because she was only given 3 days worth of medicine.  Please advise and call patient. [phone]

## 2020-09-12 NOTE — Telephone Encounter (Signed)
Go ahead and give her a refill of the antibiotic.

## 2020-09-22 ENCOUNTER — Other Ambulatory Visit: Payer: Self-pay | Admitting: Family Medicine

## 2020-12-06 ENCOUNTER — Ambulatory Visit: Payer: BC Managed Care – PPO | Admitting: Family Medicine

## 2021-02-04 ENCOUNTER — Other Ambulatory Visit: Payer: Self-pay | Admitting: Family Medicine

## 2021-02-04 DIAGNOSIS — I10 Essential (primary) hypertension: Secondary | ICD-10-CM

## 2021-02-26 ENCOUNTER — Other Ambulatory Visit: Payer: Self-pay | Admitting: Family Medicine

## 2022-02-19 IMAGING — MG DIGITAL SCREENING BILAT W/ TOMO W/ CAD
8 series · 8 of 24 positions shown · non-contrast
Comparison: Previous exam(s).

CLINICAL DATA: Screening.

EXAM:
DIGITAL SCREENING BILATERAL MAMMOGRAM WITH TOMO AND CAD

[R CC synth-2D]
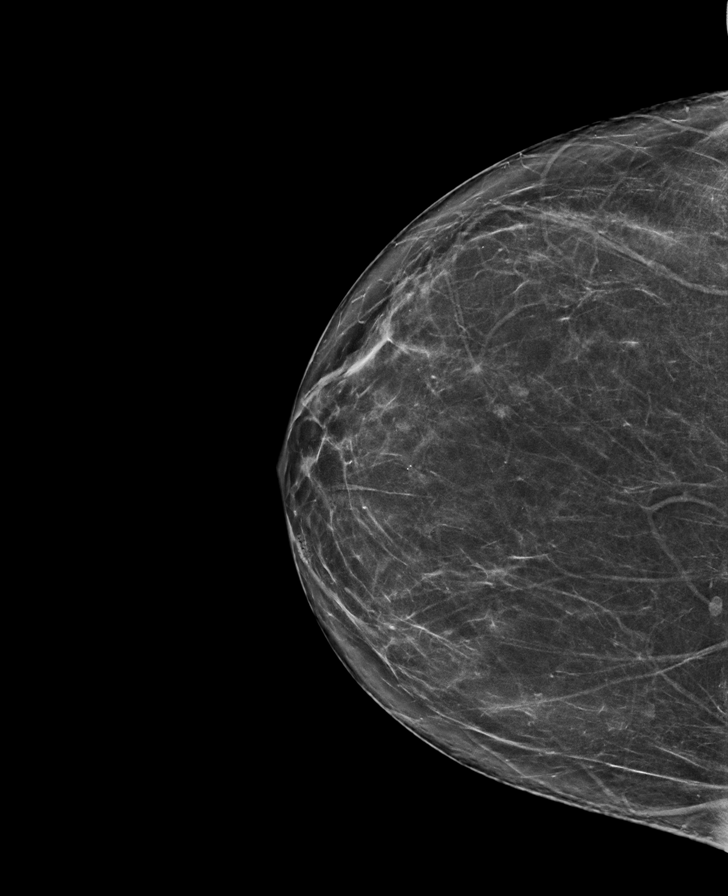

[L CC synth-2D]
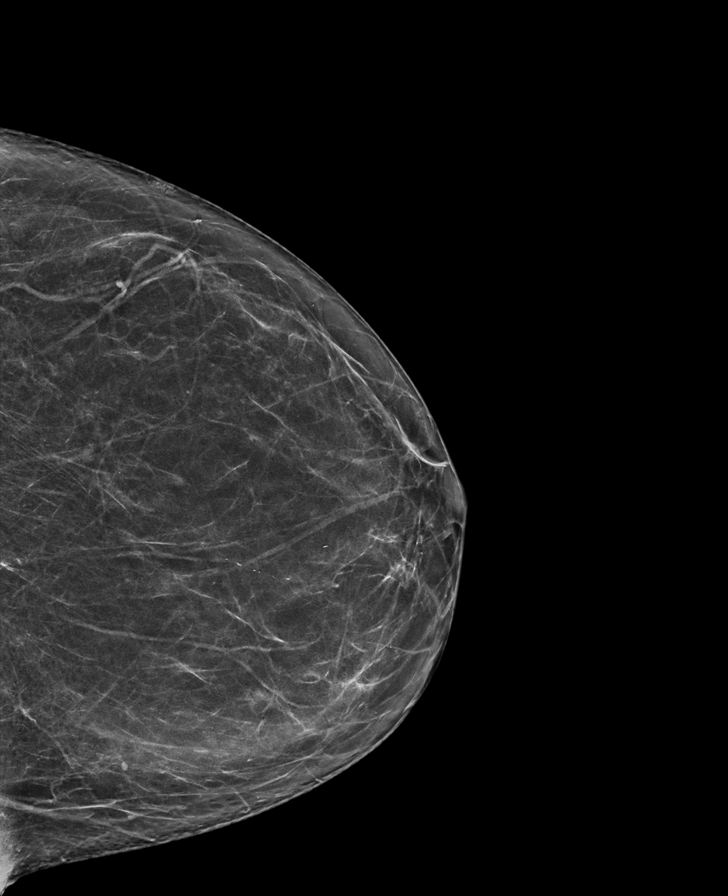

[R MLO synth-2D]
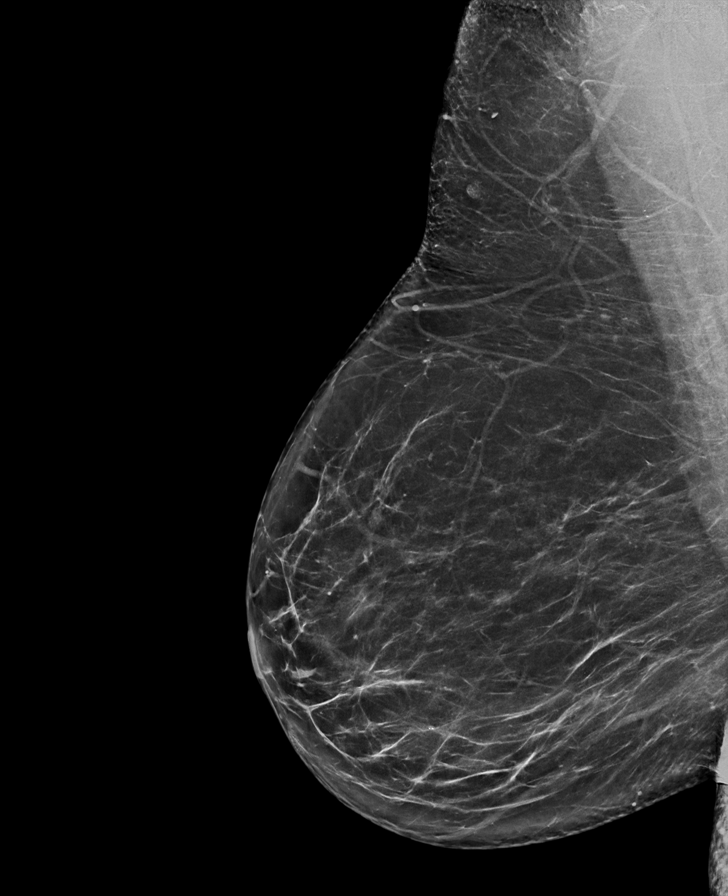

[L MLO synth-2D]
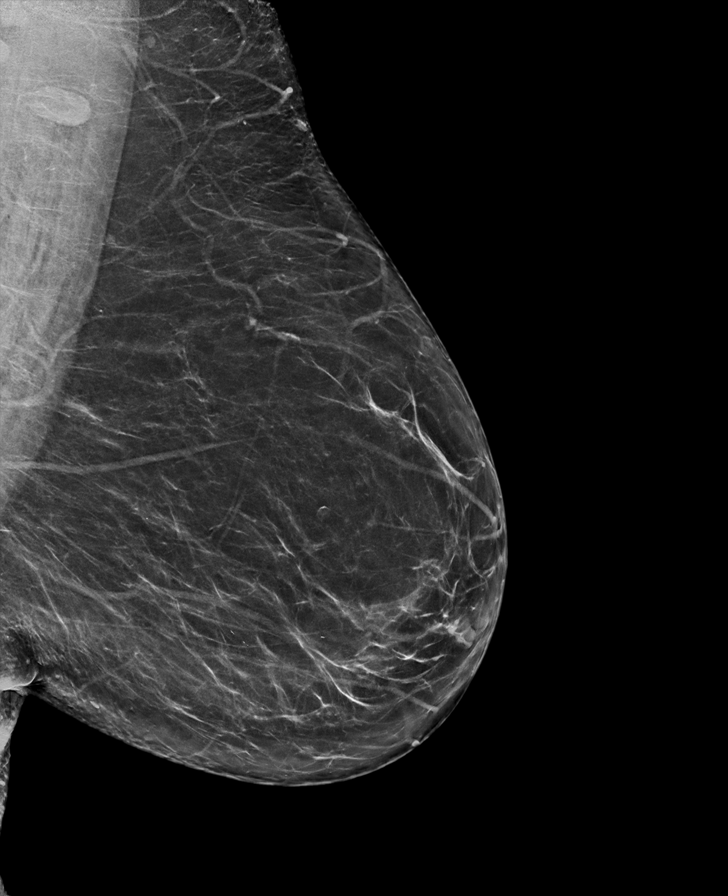

[R CC tomo · tomo slice 37/73.0]
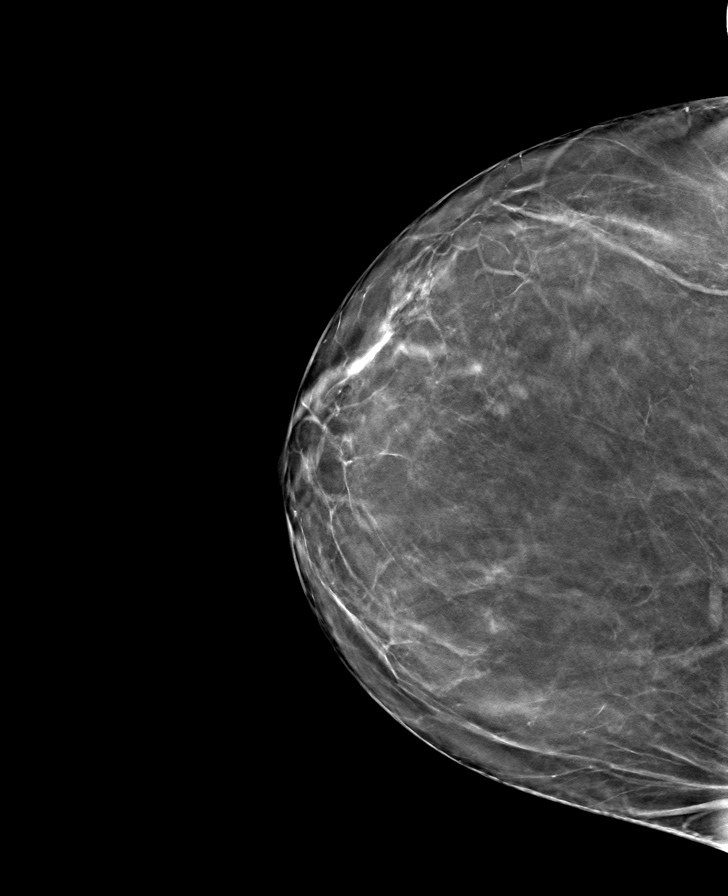

[R MLO tomo · tomo slice 43/85.0]
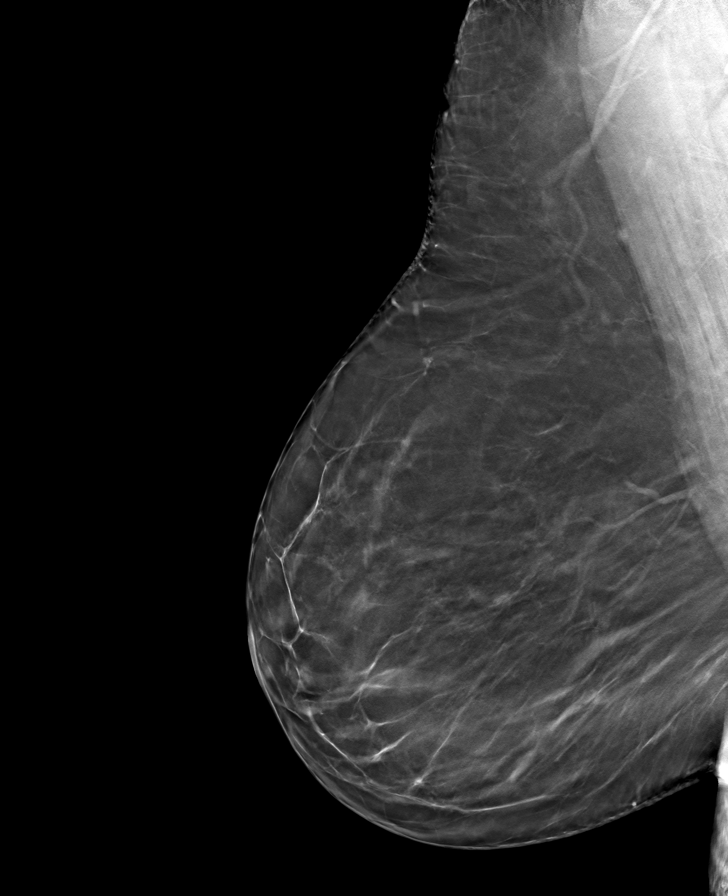

[L MLO tomo · tomo slice 41/80.0]
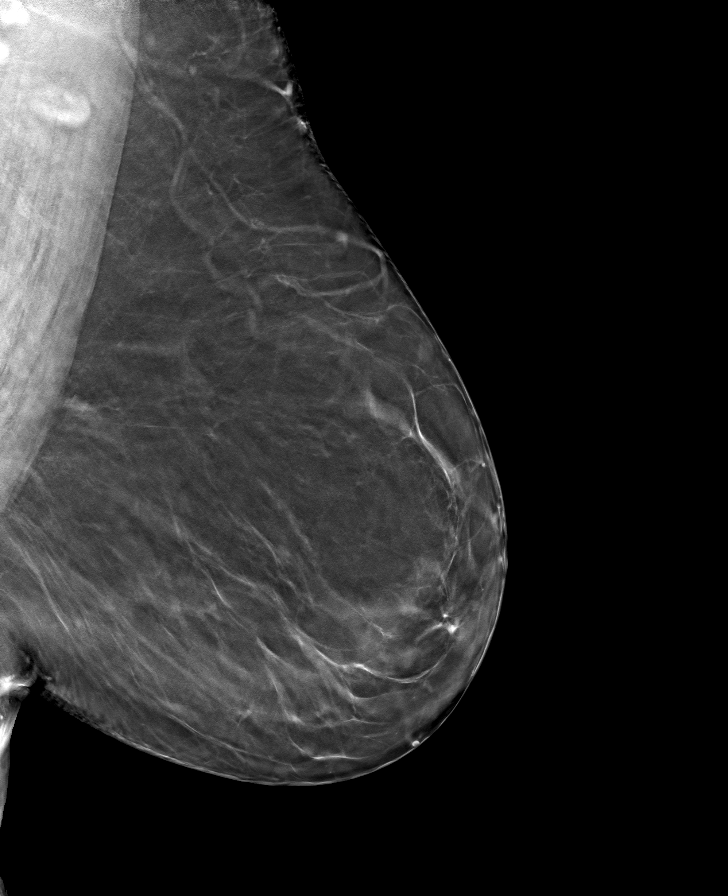

[L CC tomo · tomo slice 34/67.0]
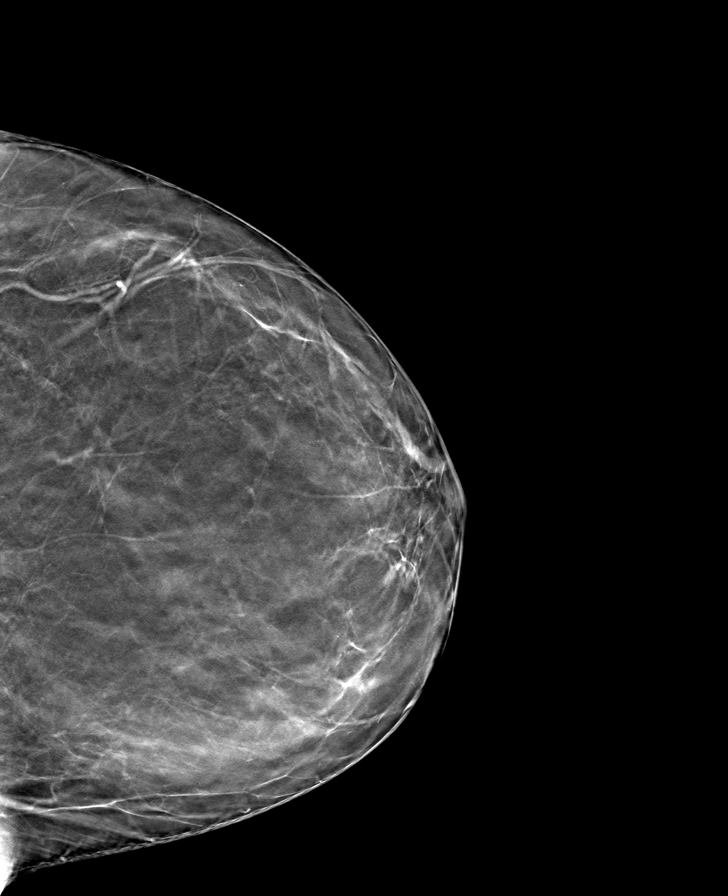

[8 of 24 positions shown; findings below may reference images not displayed]

ACR Breast Density Category b: There are scattered areas of
fibroglandular density.
FINDINGS: There are no findings suspicious for malignancy. Images were
processed with CAD.
IMPRESSION: No mammographic evidence of malignancy. A result letter of this
screening mammogram will be mailed directly to the patient.

RECOMMENDATION:
Screening mammogram in one year. (Code:[id number])

BI-RADS CATEGORY  1: Negative.

## 2022-12-04 ENCOUNTER — Encounter: Payer: Self-pay | Admitting: Family Medicine

## 2023-04-08 LAB — EXTERNAL GENERIC LAB PROCEDURE: COLOGUARD: NEGATIVE

## 2023-04-08 LAB — COLOGUARD: COLOGUARD: NEGATIVE
